# Patient Record
Sex: Female | Born: 1955 | Race: White | Hispanic: No | Marital: Married | Smoking: Former smoker
Health system: Southern US, Community
[De-identification: ages and names within clinical notes are randomized; demographics above are authoritative.]

## PROBLEM LIST (undated history)

## (undated) DIAGNOSIS — E119 Type 2 diabetes mellitus without complications: Secondary | ICD-10-CM

## (undated) DIAGNOSIS — I10 Essential (primary) hypertension: Secondary | ICD-10-CM

## (undated) HISTORY — PX: ABDOMINAL HYSTERECTOMY: SHX81

---

## 2004-08-05 ENCOUNTER — Ambulatory Visit: Payer: Self-pay | Admitting: Family Medicine

## 2005-03-02 ENCOUNTER — Ambulatory Visit: Payer: Self-pay | Admitting: Internal Medicine

## 2006-01-21 ENCOUNTER — Ambulatory Visit: Payer: Self-pay | Admitting: Internal Medicine

## 2007-02-01 ENCOUNTER — Ambulatory Visit: Payer: Self-pay | Admitting: Internal Medicine

## 2007-02-04 ENCOUNTER — Ambulatory Visit: Payer: Self-pay | Admitting: Unknown Physician Specialty

## 2008-03-21 ENCOUNTER — Ambulatory Visit: Payer: Self-pay | Admitting: Internal Medicine

## 2009-05-21 ENCOUNTER — Emergency Department: Payer: Self-pay | Admitting: Emergency Medicine

## 2009-09-10 ENCOUNTER — Ambulatory Visit: Payer: Self-pay | Admitting: Internal Medicine

## 2010-09-30 ENCOUNTER — Ambulatory Visit: Payer: Self-pay | Admitting: Internal Medicine

## 2012-05-24 ENCOUNTER — Ambulatory Visit: Payer: Self-pay | Admitting: Unknown Physician Specialty

## 2012-06-07 ENCOUNTER — Ambulatory Visit: Payer: Self-pay | Admitting: Internal Medicine

## 2012-07-15 ENCOUNTER — Ambulatory Visit: Payer: Self-pay | Admitting: Hematology and Oncology

## 2012-07-15 LAB — CBC CANCER CENTER
Basophil #: 0.1 x10 3/mm (ref 0.0–0.1)
Basophil %: 1.1 %
HCT: 39.1 % (ref 35.0–47.0)
HGB: 12.9 g/dL (ref 12.0–16.0)
Lymphocyte #: 3.1 x10 3/mm (ref 1.0–3.6)
Lymphocyte %: 41.8 %
MCH: 30.8 pg (ref 26.0–34.0)
MCHC: 33 g/dL (ref 32.0–36.0)
Neutrophil %: 46.3 %
Platelet: 289 x10 3/mm (ref 150–440)

## 2012-08-01 ENCOUNTER — Ambulatory Visit: Payer: Self-pay | Admitting: Unknown Physician Specialty

## 2012-08-02 ENCOUNTER — Ambulatory Visit: Payer: Self-pay | Admitting: Hematology and Oncology

## 2012-08-02 LAB — PATHOLOGY REPORT

## 2013-09-12 ENCOUNTER — Emergency Department: Payer: Self-pay | Admitting: Emergency Medicine

## 2013-09-12 LAB — COMPREHENSIVE METABOLIC PANEL
Albumin: 3.9 g/dL (ref 3.4–5.0)
Anion Gap: 8 (ref 7–16)
Bilirubin,Total: 0.6 mg/dL (ref 0.2–1.0)
Chloride: 102 mmol/L (ref 98–107)
Co2: 26 mmol/L (ref 21–32)
EGFR (Non-African Amer.): 56 — ABNORMAL LOW
Osmolality: 278 (ref 275–301)
SGOT(AST): 53 U/L — ABNORMAL HIGH (ref 15–37)
Sodium: 136 mmol/L (ref 136–145)
Total Protein: 8 g/dL (ref 6.4–8.2)

## 2013-09-12 LAB — URINALYSIS, COMPLETE
Blood: NEGATIVE
Granular Cast: 5
Hyaline Cast: 2
Ketone: NEGATIVE
Protein: 30
RBC,UR: 1 /HPF (ref 0–5)
Specific Gravity: 1.019 (ref 1.003–1.030)
Squamous Epithelial: <1

## 2013-09-12 LAB — CBC
HCT: 41.7 % (ref 35.0–47.0)
HGB: 14.3 g/dL (ref 12.0–16.0)
MCH: 31.1 pg (ref 26.0–34.0)
MCV: 91 fL (ref 80–100)
RBC: 4.59 10*6/uL (ref 3.80–5.20)
RDW: 12.9 % (ref 11.5–14.5)

## 2013-09-12 LAB — OCCULT BLOOD X 1 CARD TO LAB, STOOL: Occult Blood, Feces: NEGATIVE

## 2013-09-12 LAB — CLOSTRIDIUM DIFFICILE(ARMC)

## 2013-09-14 LAB — STOOL CULTURE

## 2014-04-01 DIAGNOSIS — E785 Hyperlipidemia, unspecified: Secondary | ICD-10-CM | POA: Insufficient documentation

## 2014-04-01 DIAGNOSIS — I10 Essential (primary) hypertension: Secondary | ICD-10-CM | POA: Insufficient documentation

## 2014-04-01 DIAGNOSIS — N183 Chronic kidney disease, stage 3 unspecified: Secondary | ICD-10-CM | POA: Insufficient documentation

## 2014-04-01 DIAGNOSIS — E1169 Type 2 diabetes mellitus with other specified complication: Secondary | ICD-10-CM | POA: Insufficient documentation

## 2014-04-11 ENCOUNTER — Ambulatory Visit: Payer: Self-pay | Admitting: Internal Medicine

## 2015-05-20 DIAGNOSIS — Z792 Long term (current) use of antibiotics: Secondary | ICD-10-CM | POA: Insufficient documentation

## 2015-05-20 DIAGNOSIS — Z79899 Other long term (current) drug therapy: Secondary | ICD-10-CM | POA: Insufficient documentation

## 2015-05-20 DIAGNOSIS — K5000 Crohn's disease of small intestine without complications: Secondary | ICD-10-CM | POA: Insufficient documentation

## 2015-05-20 DIAGNOSIS — E119 Type 2 diabetes mellitus without complications: Secondary | ICD-10-CM | POA: Insufficient documentation

## 2015-05-20 DIAGNOSIS — R109 Unspecified abdominal pain: Secondary | ICD-10-CM | POA: Diagnosis present

## 2015-05-20 DIAGNOSIS — Z87891 Personal history of nicotine dependence: Secondary | ICD-10-CM | POA: Insufficient documentation

## 2015-05-20 DIAGNOSIS — I10 Essential (primary) hypertension: Secondary | ICD-10-CM | POA: Diagnosis not present

## 2015-05-20 LAB — CBC WITH DIFFERENTIAL/PLATELET
BASOS ABS: 0 10*3/uL (ref 0–0.1)
BASOS PCT: 0 %
EOS ABS: 0.4 10*3/uL (ref 0–0.7)
EOS PCT: 4 %
HEMATOCRIT: 39.4 % (ref 35.0–47.0)
Hemoglobin: 13.3 g/dL (ref 12.0–16.0)
LYMPHS PCT: 27 %
Lymphs Abs: 2.6 10*3/uL (ref 1.0–3.6)
MCH: 30.5 pg (ref 26.0–34.0)
MCHC: 33.7 g/dL (ref 32.0–36.0)
MCV: 90.5 fL (ref 80.0–100.0)
Monocytes Absolute: 0.5 10*3/uL (ref 0.2–0.9)
Monocytes Relative: 5 %
Neutro Abs: 6 10*3/uL (ref 1.4–6.5)
Neutrophils Relative %: 64 %
Platelets: 301 10*3/uL (ref 150–440)
RBC: 4.35 MIL/uL (ref 3.80–5.20)
RDW: 13.9 % (ref 11.5–14.5)
WBC: 9.4 10*3/uL (ref 3.6–11.0)

## 2015-05-20 LAB — COMPREHENSIVE METABOLIC PANEL
ALBUMIN: 4 g/dL (ref 3.5–5.0)
ALK PHOS: 70 U/L (ref 38–126)
ALT: 23 U/L (ref 14–54)
AST: 23 U/L (ref 15–41)
Anion gap: 13 (ref 5–15)
BILIRUBIN TOTAL: 0.5 mg/dL (ref 0.3–1.2)
BUN: 17 mg/dL (ref 6–20)
CHLORIDE: 99 mmol/L — AB (ref 101–111)
CO2: 25 mmol/L (ref 22–32)
CREATININE: 0.88 mg/dL (ref 0.44–1.00)
Calcium: 9.3 mg/dL (ref 8.9–10.3)
GFR calc Af Amer: >60 mL/min (ref >60)
Glucose, Bld: 185 mg/dL — ABNORMAL HIGH (ref 65–99)
POTASSIUM: 3.5 mmol/L (ref 3.5–5.1)
Sodium: 137 mmol/L (ref 135–145)
Total Protein: 7.1 g/dL (ref 6.5–8.1)

## 2015-05-20 LAB — LIPASE, BLOOD: Lipase: 29 U/L (ref 22–51)

## 2015-05-20 LAB — TROPONIN I: Troponin I: <0.03 ng/mL (ref <0.031)

## 2015-05-20 NOTE — ED Notes (Signed)
Pt in with co upper abd pain, has had n.v.d since this am.

## 2015-05-21 ENCOUNTER — Emergency Department
Admission: EM | Admit: 2015-05-21 | Discharge: 2015-05-21 | Disposition: A | Discharge status: Home / Self Care | Payer: BLUE CROSS/BLUE SHIELD | Attending: Emergency Medicine | Admitting: Emergency Medicine

## 2015-05-21 ENCOUNTER — Emergency Department: Payer: BLUE CROSS/BLUE SHIELD

## 2015-05-21 ENCOUNTER — Encounter: Payer: Self-pay | Admitting: Emergency Medicine

## 2015-05-21 DIAGNOSIS — K5000 Crohn's disease of small intestine without complications: Secondary | ICD-10-CM

## 2015-05-21 DIAGNOSIS — R1110 Vomiting, unspecified: Secondary | ICD-10-CM

## 2015-05-21 DIAGNOSIS — R197 Diarrhea, unspecified: Secondary | ICD-10-CM

## 2015-05-21 HISTORY — DX: Type 2 diabetes mellitus without complications: E11.9

## 2015-05-21 HISTORY — DX: Essential (primary) hypertension: I10

## 2015-05-21 LAB — URINALYSIS COMPLETE WITH MICROSCOPIC (ARMC ONLY)
Bilirubin Urine: NEGATIVE
GLUCOSE, UA: NEGATIVE mg/dL
Hgb urine dipstick: NEGATIVE
Ketones, ur: NEGATIVE mg/dL
Nitrite: NEGATIVE
PROTEIN: NEGATIVE mg/dL
Specific Gravity, Urine: 1.02 (ref 1.005–1.030)
pH: 5 (ref 5.0–8.0)

## 2015-05-21 LAB — TROPONIN I: Troponin I: <0.03 ng/mL (ref <0.031)

## 2015-05-21 MED ORDER — SODIUM CHLORIDE 0.9 % IV BOLUS (SEPSIS)
1000.0000 mL | Freq: Once | INTRAVENOUS | Status: AC
  Administered 2015-05-21: 1000 mL via INTRAVENOUS

## 2015-05-21 MED ORDER — METOCLOPRAMIDE HCL 5 MG/ML IJ SOLN
10.0000 mg | Freq: Once | INTRAMUSCULAR | Status: AC
  Administered 2015-05-21: 10 mg via INTRAVENOUS
  Filled 2015-05-21: qty 2

## 2015-05-21 MED ORDER — ONDANSETRON HCL 4 MG/2ML IJ SOLN
4.0000 mg | Freq: Once | INTRAMUSCULAR | Status: AC
  Administered 2015-05-21: 4 mg via INTRAVENOUS
  Filled 2015-05-21: qty 2

## 2015-05-21 MED ORDER — METOCLOPRAMIDE HCL 10 MG PO TABS: 10.0000 mg | ORAL_TABLET | Freq: Four times a day (QID) | ORAL | Status: DC | PRN

## 2015-05-21 MED ORDER — METRONIDAZOLE IN NACL 5-0.79 MG/ML-% IV SOLN
500.0000 mg | Freq: Once | INTRAVENOUS | Status: AC
  Administered 2015-05-21: 500 mg via INTRAVENOUS
  Filled 2015-05-21: qty 100

## 2015-05-21 MED ORDER — CIPROFLOXACIN IN D5W 400 MG/200ML IV SOLN
400.0000 mg | Freq: Once | INTRAVENOUS | Status: AC
  Administered 2015-05-21: 400 mg via INTRAVENOUS
  Filled 2015-05-21 (×2): qty 200

## 2015-05-21 MED ORDER — METRONIDAZOLE 500 MG PO TABS: 500.0000 mg | ORAL_TABLET | Freq: Three times a day (TID) | ORAL | Status: AC

## 2015-05-21 MED ORDER — IOHEXOL 240 MG/ML SOLN
25.0000 mL | Freq: Once | INTRAMUSCULAR | Status: AC | PRN
  Administered 2015-05-21: 25 mL via ORAL

## 2015-05-21 MED ORDER — CIPROFLOXACIN HCL 500 MG PO TABS
500.0000 mg | ORAL_TABLET | Freq: Two times a day (BID) | ORAL | Status: AC
Start: 2015-05-21 — End: 2015-05-28

## 2015-05-21 MED ORDER — IOHEXOL 300 MG/ML  SOLN
100.0000 mL | Freq: Once | INTRAMUSCULAR | Status: AC | PRN
  Administered 2015-05-21: 100 mL via INTRAVENOUS

## 2015-05-21 NOTE — ED Provider Notes (Signed)
[facility] Emergency Department Provider Note  ____________________________________________  Time seen: Approximately 0051 AM  I have reviewed the triage vital signs and the nursing notes.   HISTORY  Chief Complaint Abdominal Pain    HPI Regina Moreno is a 59 y.o. female comes in with upper abdominal pain, vomiting and diarrhea. The patient reports that the pain started midmorning and she thought it was gas but the pain continued to progress during the day. The patient reports that she ate very little but vomited. The patient reports that she also had some diarrhea. The patient reports that she continued to vomit and had cramping in her feet and legs felt so severe. The patient walked around and every time she would lay down she would vomit. The patient reports that throughout the day she became dizzy and lightheaded. Currently her pain is a 2 out of 10 in intensity. She reports the emesis was nonbilious and nonbloody. The patient reports that she was unable to tolerate the pain at home so she decided to come in for evaluation and hydration.   Past Medical History  Diagnosis Date  . Diabetes mellitus without complication   . Hypertension     There are no active problems to display for this patient.   History reviewed. No pertinent past surgical history.  Current Outpatient Rx  Name  Route  Sig  Dispense  Refill  . bisoprolol (ZEBETA) 10 MG tablet   Oral   Take 1 tablet by mouth daily.         Marland Kitchen glipiZIDE-metformin (METAGLIP) 2.5-500 MG per tablet   Oral   Take 2 tablets by mouth 2 (two) times daily.         . Liraglutide (VICTOZA) 18 MG/3ML SOPN   Subcutaneous   Inject 1.8 mg into the skin every morning.         Marland Kitchen losartan-hydrochlorothiazide (HYZAAR) 100-12.5 MG per tablet   Oral   Take 1 tablet by mouth daily.         Marland Kitchen omeprazole (PRILOSEC) 20 MG capsule   Oral   Take 1 capsule by mouth daily.         . ciprofloxacin  (CIPRO) 500 MG tablet   Oral   Take 1 tablet (500 mg total) by mouth 2 (two) times daily.   14 tablet   0   . metoCLOPramide (REGLAN) 10 MG tablet   Oral   Take 1 tablet (10 mg total) by mouth every 6 (six) hours as needed for nausea or vomiting.   20 tablet   0   . metroNIDAZOLE (FLAGYL) 500 MG tablet   Oral   Take 1 tablet (500 mg total) by mouth 3 (three) times daily.   14 tablet   0     Allergies Review of patient's allergies indicates no active allergies.  History reviewed. No pertinent family history.  Social History History  Substance Use Topics  . Smoking status: Former Smoker    Types: Cigarettes  . Smokeless tobacco: Not on file  . Alcohol Use: No    Review of Systems Constitutional: No fever/chills Eyes: No visual changes. ENT: No sore throat. Cardiovascular: Denies chest pain. Respiratory: Denies shortness of breath. Gastrointestinal:abdominal pain.  Nausea, and vomiting.  No diarrhea.  No constipation. Genitourinary: Negative for dysuria. Musculoskeletal: Negative for back pain. Skin: Negative for rash. Neurological: Negative for headaches, focal weakness or numbness.  10-point ROS otherwise negative.  ____________________________________________   PHYSICAL EXAM:  VITAL SIGNS: ED  Triage Vitals  Enc Vitals Group     BP 05/20/15 2248 133/82 mmHg     Pulse Rate 05/20/15 2248 100     Resp 05/20/15 2248 18     Temp 05/20/15 2248 99 F (37.2 C)     Temp Source 05/20/15 2248 Oral     SpO2 05/20/15 2248 97 %     Weight 05/20/15 2248 190 lb (86.183 kg)     Height 05/20/15 2248 5\' 6"  (1.676 m)     Head Cir --      Peak Flow --      Pain Score 05/20/15 2249 6     Pain Loc --      Pain Edu? --      Excl. in GC? --     Constitutional: Alert and oriented. Well appearing and in no acute distress. Eyes: Conjunctivae are normal. PERRL. EOMI. Head: Atraumatic. Nose: No congestion/rhinnorhea. Mouth/Throat: Mucous membranes are moist.  Oropharynx  non-erythematous. Cardiovascular: Normal rate, regular rhythm. Grossly normal heart sounds.  Good peripheral circulation. Respiratory: Normal respiratory effort.  No retractions. Lungs CTAB. Gastrointestinal: Soft and nontender. No distention. Positive bowel sounds Genitourinary: deferred Musculoskeletal: No lower extremity tenderness nor edema.  No joint effusions. Neurologic:  Normal speech and language. No gross focal neurologic deficits are appreciated. No gait instability. Skin:  Skin is warm, dry and intact. No rash noted. Psychiatric: Mood and affect are normal.   ____________________________________________   LABS (all labs ordered are listed, but only abnormal results are displayed)  Labs Reviewed  COMPREHENSIVE METABOLIC PANEL - Abnormal; Notable for the following:    Chloride 99 (*)    Glucose, Bld 185 (*)    All other components within normal limits  URINALYSIS COMPLETEWITH MICROSCOPIC ([facility] ONLY) - Abnormal; Notable for the following:    Color, Urine YELLOW (*)    APPearance CLEAR (*)    Leukocytes, UA TRACE (*)    Bacteria, UA RARE (*)    Squamous Epithelial / LPF 0-5 (*)    All other components within normal limits  CBC WITH DIFFERENTIAL/PLATELET  LIPASE, BLOOD  TROPONIN I  TROPONIN I   ____________________________________________  EKG  None ____________________________________________  RADIOLOGY  CT scan: Apparent mild wall thickening along the terminal ileum with mild stranding about the distal ileum. This may reflect a mild infectious or inflammatory ileitis ____________________________________________   PROCEDURES  Procedure(s) performed: None  Critical Care performed: No  ____________________________________________   INITIAL IMPRESSION / ASSESSMENT AND PLAN / ED COURSE  Pertinent labs & imaging results that were available during my care of the patient were reviewed by me and considered in my medical decision making (see chart for  details).  This is a 59 year old female who comes in with abdominal pain vomiting and diarrhea. Initially the patient's pain was controlled and her vomiting was improved but the patient reports that she started vomiting after receiving the dose of Zofran. The patient received Zofran and since she was having significant pain a CT scan was did not show any acute injuries the patient did receive some ciprofloxacin and Flagyl. The patient reports that she does feel much better and she'll be discharged home. ____________________________________________   FINAL CLINICAL IMPRESSION(S) / ED DIAGNOSES  Final diagnoses:  Ileitis, terminal, without complications  Vomiting and diarrhea      Rebecka ApleyAllison P Chloie Loney, MD 05/21/15 [phone]

## 2015-05-21 NOTE — ED Notes (Signed)
MD at bedside checking in on pt

## 2015-05-21 NOTE — Discharge Instructions (Signed)
Nausea and Vomiting Nausea is a sick feeling that often comes before throwing up (vomiting). Vomiting is a reflex where stomach contents come out of your mouth. Vomiting can cause severe loss of body fluids (dehydration). Children and elderly adults can become dehydrated quickly, especially if they also have diarrhea. Nausea and vomiting are symptoms of a condition or disease. It is important to find the cause of your symptoms. CAUSES   Direct irritation of the stomach lining. This irritation can result from increased acid production (gastroesophageal reflux disease), infection, food poisoning, taking certain medicines (such as nonsteroidal anti-inflammatory drugs), alcohol use, or tobacco use.  Signals from the brain.These signals could be caused by a headache, heat exposure, an inner ear disturbance, increased pressure in the brain from injury, infection, a tumor, or a concussion, pain, emotional stimulus, or metabolic problems.  An obstruction in the gastrointestinal tract (bowel obstruction).  Illnesses such as diabetes, hepatitis, gallbladder problems, appendicitis, kidney problems, cancer, sepsis, atypical symptoms of a heart attack, or eating disorders.  Medical treatments such as chemotherapy and radiation.  Receiving medicine that makes you sleep (general anesthetic) during surgery. DIAGNOSIS Your caregiver may ask for tests to be done if the problems do not improve after a few days. Tests may also be done if symptoms are severe or if the reason for the nausea and vomiting is not clear. Tests may include:  Urine tests.  Blood tests.  Stool tests.  Cultures (to look for evidence of infection).  X-rays or other imaging studies. Test results can help your caregiver make decisions about treatment or the need for additional tests. TREATMENT You need to stay well hydrated. Drink frequently but in small amounts.You may wish to drink water, sports drinks, clear broth, or eat frozen  ice pops or gelatin dessert to help stay hydrated.When you eat, eating slowly may help prevent nausea.There are also some antinausea medicines that may help prevent nausea. HOME CARE INSTRUCTIONS   Take all medicine as directed by your caregiver.  If you do not have an appetite, do not force yourself to eat. However, you must continue to drink fluids.  If you have an appetite, eat a normal diet unless your caregiver tells you differently.  Eat a variety of complex carbohydrates (rice, wheat, potatoes, bread), lean meats, yogurt, fruits, and vegetables.  Avoid high-fat foods because they are more difficult to digest.  Drink enough water and fluids to keep your urine clear or pale yellow.  If you are dehydrated, ask your caregiver for specific rehydration instructions. Signs of dehydration may include:  Severe thirst.  Dry lips and mouth.  Dizziness.  Dark urine.  Decreasing urine frequency and amount.  Confusion.  Rapid breathing or pulse. SEEK IMMEDIATE MEDICAL CARE IF:   You have blood or brown flecks (like coffee grounds) in your vomit.  You have black or bloody stools.  You have a severe headache or stiff neck.  You are confused.  You have severe abdominal pain.  You have chest pain or trouble breathing.  You do not urinate at least once every 8 hours.  You develop cold or clammy skin.  You continue to vomit for longer than 24 to 48 hours.  You have a fever. MAKE SURE YOU:   Understand these instructions.  Will watch your condition.  Will get help right away if you are not doing well or get worse. Document Released: 10/19/2005 Document Revised: 01/11/2012 Document Reviewed: 03/18/2011 [employer] Patient Information 2015 [employer]. This information is not intended   to replace advice given to you by your health care provider. Make sure you discuss any questions you have with your health care provider.  Colitis Colitis is inflammation of the colon.  Colitis can be a short-term or long-standing (chronic) illness. Crohn's disease and ulcerative colitis are 2 types of colitis which are chronic. They usually require lifelong treatment. CAUSES  There are many different causes of colitis, including:  Viruses.  Germs (bacteria).  Medicine reactions. SYMPTOMS   Diarrhea.  Intestinal bleeding.  Pain.  Fever.  Throwing up (vomiting).  Tiredness (fatigue).  Weight loss.  Bowel blockage. DIAGNOSIS  The diagnosis of colitis is based on examination and stool or blood tests. X-rays, CT scan, and colonoscopy may also be needed. TREATMENT  Treatment may include:  Fluids given through the vein (intravenously).  Bowel rest (nothing to eat or drink for a period of time).  Medicine for pain and diarrhea.  Medicines (antibiotics) that kill germs.  Cortisone medicines.  Surgery. HOME CARE INSTRUCTIONS   Get plenty of rest.  Drink enough water and fluids to keep your urine clear or pale yellow.  Eat a well-balanced diet.  Call your caregiver for follow-up as recommended. SEEK IMMEDIATE MEDICAL CARE IF:   You develop chills.  You have an oral temperature above 102 F (38.9 C), not controlled by medicine.  You have extreme weakness, fainting, or dehydration.  You have repeated vomiting.  You develop severe belly (abdominal) pain or are passing bloody or tarry stools. MAKE SURE YOU:   Understand these instructions.  Will watch your condition.  Will get help right away if you are not doing well or get worse. Document Released: 11/26/2004 Document Revised: 01/11/2012 Document Reviewed: 02/21/2010 [facility] Patient Information 2015 [place], [place]. This information is not intended to replace advice given to you by your health care provider. Make sure you discuss any questions you have with your health care provider.  Diarrhea Diarrhea is frequent loose and watery bowel movements. It can cause you to feel weak and  dehydrated. Dehydration can cause you to become tired and thirsty, have a dry mouth, and have decreased urination that often is dark yellow. Diarrhea is a sign of another problem, most often an infection that will not last long. In most cases, diarrhea typically lasts 2-3 days. However, it can last longer if it is a sign of something more serious. It is important to treat your diarrhea as directed by your caregiver to lessen or prevent future episodes of diarrhea. CAUSES  Some common causes include:  Gastrointestinal infections caused by viruses, bacteria, or parasites.  Food poisoning or food allergies.  Certain medicines, such as antibiotics, chemotherapy, and laxatives.  Artificial sweeteners and fructose.  Digestive disorders. HOME CARE INSTRUCTIONS  Ensure adequate fluid intake (hydration): Have 1 cup (8 oz) of fluid for each diarrhea episode. Avoid fluids that contain simple sugars or sports drinks, fruit juices, whole milk products, and sodas. Your urine should be clear or pale yellow if you are drinking enough fluids. Hydrate with an oral rehydration solution that you can purchase at pharmacies, retail stores, and online. You can prepare an oral rehydration solution at home by mixing the following ingredients together:   - tsp table salt.   tsp baking soda.   tsp salt substitute containing potassium chloride.  1  tablespoons sugar.  1 L (34 oz) of water.  Certain foods and beverages may increase the speed at which food moves through the gastrointestinal (GI) tract. These foods and beverages  should be avoided and include:  Caffeinated and alcoholic beverages.  High-fiber foods, such as raw fruits and vegetables, nuts, seeds, and whole grain breads and cereals.  Foods and beverages sweetened with sugar alcohols, such as xylitol, sorbitol, and mannitol.  Some foods may be well tolerated and may help thicken stool including:  Starchy foods, such as rice, toast, pasta,  low-sugar cereal, oatmeal, grits, baked potatoes, crackers, and bagels.  Bananas.  Applesauce.  Add probiotic-rich foods to help increase healthy bacteria in the GI tract, such as yogurt and fermented milk products.  Wash your hands well after each diarrhea episode.  Only take over-the-counter or prescription medicines as directed by your caregiver.  Take a warm bath to relieve any burning or pain from frequent diarrhea episodes. SEEK IMMEDIATE MEDICAL CARE IF:   You are unable to keep fluids down.  You have persistent vomiting.  You have blood in your stool, or your stools are black and tarry.  You do not urinate in 6-8 hours, or there is only a small amount of very dark urine.  You have abdominal pain that increases or localizes.  You have weakness, dizziness, confusion, or light-headedness.  You have a severe headache.  Your diarrhea gets worse or does not get better.  You have a fever or persistent symptoms for more than 2-3 days.  You have a fever and your symptoms suddenly get worse. MAKE SURE YOU:   Understand these instructions.  Will watch your condition.  Will get help right away if you are not doing well or get worse. Document Released: 10/09/2002 Document Revised: 03/05/2014 Document Reviewed: 06/26/2012 [facility] Patient Information 2015 [place]. This information is not intended to replace advice given to you by your health care provider. Make sure you discuss any questions you have with your health care provider.

## 2015-10-09 DIAGNOSIS — Z0000 Encounter for general adult medical examination without abnormal findings: Secondary | ICD-10-CM | POA: Insufficient documentation

## 2015-10-09 DIAGNOSIS — Z1211 Encounter for screening for malignant neoplasm of colon: Secondary | ICD-10-CM | POA: Insufficient documentation

## 2018-02-08 ENCOUNTER — Ambulatory Visit
Admission: RE | Admit: 2018-02-08 | Discharge: 2018-02-08 | Disposition: A | Discharge status: Home / Self Care | Payer: BLUE CROSS/BLUE SHIELD | Source: Ambulatory Visit | Attending: Internal Medicine | Admitting: Internal Medicine

## 2018-02-08 ENCOUNTER — Other Ambulatory Visit: Payer: Self-pay | Admitting: Internal Medicine

## 2018-02-08 DIAGNOSIS — Z1231 Encounter for screening mammogram for malignant neoplasm of breast: Secondary | ICD-10-CM

## 2018-03-14 ENCOUNTER — Other Ambulatory Visit: Payer: Self-pay

## 2018-03-14 ENCOUNTER — Emergency Department
Admission: EM | Admit: 2018-03-14 | Discharge: 2018-03-14 | Disposition: A | Discharge status: Home / Self Care | Payer: BLUE CROSS/BLUE SHIELD | Attending: Emergency Medicine | Admitting: Emergency Medicine

## 2018-03-14 ENCOUNTER — Encounter: Payer: Self-pay | Admitting: Emergency Medicine

## 2018-03-14 DIAGNOSIS — E119 Type 2 diabetes mellitus without complications: Secondary | ICD-10-CM | POA: Insufficient documentation

## 2018-03-14 DIAGNOSIS — Z7984 Long term (current) use of oral hypoglycemic drugs: Secondary | ICD-10-CM | POA: Insufficient documentation

## 2018-03-14 DIAGNOSIS — Z79899 Other long term (current) drug therapy: Secondary | ICD-10-CM | POA: Diagnosis not present

## 2018-03-14 DIAGNOSIS — Z87891 Personal history of nicotine dependence: Secondary | ICD-10-CM | POA: Insufficient documentation

## 2018-03-14 DIAGNOSIS — I10 Essential (primary) hypertension: Secondary | ICD-10-CM | POA: Insufficient documentation

## 2018-03-14 DIAGNOSIS — R55 Syncope and collapse: Secondary | ICD-10-CM | POA: Insufficient documentation

## 2018-03-14 LAB — CBC
HCT: 44.8 % (ref 35.0–47.0)
Hemoglobin: 15.1 g/dL (ref 12.0–16.0)
MCH: 31.1 pg (ref 26.0–34.0)
MCHC: 33.8 g/dL (ref 32.0–36.0)
MCV: 92.1 fL (ref 80.0–100.0)
PLATELETS: 337 10*3/uL (ref 150–440)
RBC: 4.86 MIL/uL (ref 3.80–5.20)
RDW: 13.5 % (ref 11.5–14.5)
WBC: 9.9 10*3/uL (ref 3.6–11.0)

## 2018-03-14 LAB — URINALYSIS, COMPLETE (UACMP) WITH MICROSCOPIC
Bilirubin Urine: NEGATIVE
Glucose, UA: >=500 mg/dL — AB
Hgb urine dipstick: NEGATIVE
Ketones, ur: 5 mg/dL — AB
Leukocytes, UA: NEGATIVE
Nitrite: NEGATIVE
PH: 5 (ref 5.0–8.0)
Protein, ur: NEGATIVE mg/dL
SPECIFIC GRAVITY, URINE: 1.022 (ref 1.005–1.030)

## 2018-03-14 LAB — TROPONIN I

## 2018-03-14 LAB — BASIC METABOLIC PANEL
Anion gap: 14 (ref 5–15)
BUN: 19 mg/dL (ref 6–20)
CALCIUM: 9.6 mg/dL (ref 8.9–10.3)
CO2: 24 mmol/L (ref 22–32)
Chloride: 96 mmol/L — ABNORMAL LOW (ref 101–111)
Creatinine, Ser: 1.08 mg/dL — ABNORMAL HIGH (ref 0.44–1.00)
GFR calc non Af Amer: 54 mL/min — ABNORMAL LOW (ref >60)
Glucose, Bld: 175 mg/dL — ABNORMAL HIGH (ref 65–99)
Potassium: 4.5 mmol/L (ref 3.5–5.1)
SODIUM: 134 mmol/L — AB (ref 135–145)

## 2018-03-14 MED ORDER — SODIUM CHLORIDE 0.9 % IV BOLUS
1000.0000 mL | Freq: Once | INTRAVENOUS | Status: AC
  Administered 2018-03-14: 1000 mL via INTRAVENOUS

## 2018-03-14 NOTE — ED Triage Notes (Signed)
Says was at work and she got lightheaded and felt like she was passing out.  Has had congestion for several days.  Then everytime she moved it made her feel like passing outand dizziness.

## 2018-03-14 NOTE — ED Provider Notes (Signed)
[facility] Emergency Department Provider Note  ____________________________________________   First MD Initiated Contact with Patient 03/14/18 1401     (approximate)  I have reviewed the triage vital signs and the nursing notes.   HISTORY  Chief Complaint Near Syncope; Dizziness; and Nasal Congestion   HPI Regina Moreno is a 62 y.o. female with a history of diabetes as well as hypertension was presenting to the emergency department today after near syncopal episode.  She says that she is feeling ill over the past 2 weeks with nasal congestion.  She went to the doctor this past Friday and was given a prescription for Levaquin.  Says that the Levaquin has caused some degree of diarrhea as well as abdominal cramping.  Says that she was standing up doing some filing at work today when she began to feel hot and lightheaded.  She said that she had to sit down and recline for about 45 minutes but the symptoms did not abate especially when she tried to sit up.  Ambulance was called and she had a normal EKG.  She then came to the emergency department for further evaluation.  Denies any chest pain, shortness of breath or palpitations.  Said that she has had issues with dehydration in the past requiring IV fluids.  Says that this feels similar.  Says that she feels a slight lightheadedness feeling now but otherwise denies any complaints.   Past Medical History:  Diagnosis Date  . Diabetes mellitus without complication (HCC)   . Hypertension     There are no active problems to display for this patient.   Past Surgical History:  Procedure Laterality Date  . ABDOMINAL HYSTERECTOMY      Prior to Admission medications   Medication Sig Start Date End Date Taking? Authorizing Provider  bisoprolol (ZEBETA) 10 MG tablet Take 1 tablet by mouth daily.    [provider]  glipiZIDE-metformin (METAGLIP) 2.5-500 MG per tablet Take 2 tablets by mouth 2 (two) times  daily.    [provider]  Liraglutide (VICTOZA) 18 MG/3ML SOPN Inject 1.8 mg into the skin every morning.    [provider]  losartan-hydrochlorothiazide (HYZAAR) 100-12.5 MG per tablet Take 1 tablet by mouth daily.    [provider]  metoCLOPramide (REGLAN) 10 MG tablet Take 1 tablet (10 mg total) by mouth every 6 (six) hours as needed for nausea or vomiting. 05/21/15 05/20/16  Rebecka Apley, MD  omeprazole (PRILOSEC) 20 MG capsule Take 1 capsule by mouth daily.    [provider]    Allergies Ampicillin; Fenofibrate; Simvastatin; and Sulfa antibiotics  Family History  Problem Relation Age of Onset  . Breast cancer Neg Hx     Social History Social History   Tobacco Use  . Smoking status: Former Smoker    Types: Cigarettes  . Smokeless tobacco: Never Used  Substance Use Topics  . Alcohol use: No  . Drug use: No    Review of Systems  Constitutional: No fever/chills Eyes: No visual changes. ENT: No sore throat. Cardiovascular: Denies chest pain. Respiratory: Denies shortness of breath. Gastrointestinal: No abdominal pain.  no vomiting.  No constipation. Genitourinary: Negative for dysuria. Musculoskeletal: Negative for back pain. Skin: Negative for rash. Neurological: Negative for headaches, focal weakness or numbness.   ____________________________________________   PHYSICAL EXAM:  VITAL SIGNS: ED Triage Vitals  Enc Vitals Group     BP 03/14/18 1256 (!) 144/78     Pulse Rate 03/14/18 1256  91     Resp 03/14/18 1256 18     Temp 03/14/18 1256 97.7 F (36.5 C)     Temp Source 03/14/18 1256 Oral     SpO2 03/14/18 1256 98 %     Weight 03/14/18 1256 175 lb (79.4 kg)     Height 03/14/18 1256  (1.676 m)     Head Circumference --      Peak Flow --      Pain Score 03/14/18 1303 0     Pain Loc --      Pain Edu? --      Excl. in GC? --     Constitutional: Alert and oriented. Well appearing and in no acute  distress. Eyes: Conjunctivae are normal.  Head: Atraumatic.  Normal TMs bilaterally. Nose: No congestion/rhinnorhea. Mouth/Throat: Mucous membranes are moist.  Neck: No stridor.   Cardiovascular: Normal rate, regular rhythm. Grossly normal heart sounds.  Good peripheral circulation. Respiratory: Normal respiratory effort.  No retractions. Lungs CTAB. Gastrointestinal: Soft and nontender. No distention. No CVA tenderness. Musculoskeletal: No lower extremity tenderness nor edema.  No joint effusions. Neurologic:  Normal speech and language. No gross focal neurologic deficits are appreciated.  No nystagmus.  No ataxia on finger-to-nose testing.  No ataxia on heel-to-shin testing. Skin:  Skin is warm, dry and intact. No rash noted. Psychiatric: Mood and affect are normal. Speech and behavior are normal.  ____________________________________________   LABS (all labs ordered are listed, but only abnormal results are displayed)  Labs Reviewed  BASIC METABOLIC PANEL - Abnormal; Notable for the following components:      Result Value   Sodium 134 (*)    Chloride 96 (*)    Glucose, Bld 175 (*)    Creatinine, Ser 1.08 (*)    GFR calc non Af Amer 54 (*)    All other components within normal limits  URINALYSIS, COMPLETE (UACMP) WITH MICROSCOPIC - Abnormal; Notable for the following components:   Color, Urine YELLOW (*)    APPearance CLEAR (*)    Glucose, UA >=500 (*)    Ketones, ur 5 (*)    Bacteria, UA RARE (*)    All other components within normal limits  CBC  TROPONIN I  CBG MONITORING, ED   ____________________________________________  EKG  ED ECG REPORT I, Arelia Longest, the attending physician, personally viewed and interpreted this ECG.   Date: 03/14/2018  EKG Time: 1314  Rate: 91  Rhythm: normal sinus rhythm  Axis: Normal  Intervals:none  ST&T Change: No ST segment elevation or depression.  No abnormal T wave  inversion.  ____________________________________________  RADIOLOGY   ____________________________________________   PROCEDURES  Procedure(s) performed:   Procedures  Critical Care performed:   ____________________________________________   INITIAL IMPRESSION / ASSESSMENT AND PLAN / ED COURSE  Pertinent labs & imaging results that were available during my care of the patient were reviewed by me and considered in my medical decision making (see chart for details).  DDX: Sinusitis, near-syncope, arrhythmia, vertigo, dehydration, ACS As part of my medical decision making, I reviewed the following data within the electronic MEDICAL RECORD NUMBER Notes from prior ED visits  ----------------------------------------- 4:29 PM on 03/14/2018 -----------------------------------------  Patient feeling improved after IV fluids.  Likely vasovagal symptoms related to dehydration.  Patient understand the diagnosis as well as treatment plan willing to comply but will be discharged at this time. ____________________________________________   FINAL CLINICAL IMPRESSION(S) / ED DIAGNOSES  Near syncope.    NEW MEDICATIONS STARTED  DURING THIS VISIT:  New Prescriptions   No medications on file     Note:  This document was prepared using Dragon voice recognition software and may include unintentional dictation errors.     Myrna Blazer, MD 03/14/18 [phone]

## 2018-08-23 ENCOUNTER — Emergency Department
Admission: EM | Admit: 2018-08-23 | Discharge: 2018-08-23 | Disposition: A | Discharge status: Home / Self Care | Payer: BLUE CROSS/BLUE SHIELD | Attending: Emergency Medicine | Admitting: Emergency Medicine

## 2018-08-23 ENCOUNTER — Other Ambulatory Visit: Payer: Self-pay

## 2018-08-23 DIAGNOSIS — R42 Dizziness and giddiness: Secondary | ICD-10-CM | POA: Diagnosis not present

## 2018-08-23 DIAGNOSIS — E119 Type 2 diabetes mellitus without complications: Secondary | ICD-10-CM | POA: Diagnosis not present

## 2018-08-23 DIAGNOSIS — Z87891 Personal history of nicotine dependence: Secondary | ICD-10-CM | POA: Diagnosis not present

## 2018-08-23 DIAGNOSIS — Z7984 Long term (current) use of oral hypoglycemic drugs: Secondary | ICD-10-CM | POA: Diagnosis not present

## 2018-08-23 DIAGNOSIS — E860 Dehydration: Secondary | ICD-10-CM | POA: Diagnosis not present

## 2018-08-23 DIAGNOSIS — R531 Weakness: Secondary | ICD-10-CM | POA: Diagnosis present

## 2018-08-23 DIAGNOSIS — I10 Essential (primary) hypertension: Secondary | ICD-10-CM | POA: Diagnosis not present

## 2018-08-23 DIAGNOSIS — Z79899 Other long term (current) drug therapy: Secondary | ICD-10-CM | POA: Diagnosis not present

## 2018-08-23 LAB — CBC
HEMATOCRIT: 44.1 % (ref 36.0–46.0)
HEMOGLOBIN: 14.9 g/dL (ref 12.0–15.0)
MCH: 30.3 pg (ref 26.0–34.0)
MCHC: 33.8 g/dL (ref 30.0–36.0)
MCV: 89.8 fL (ref 80.0–100.0)
NRBC: 0 % (ref 0.0–0.2)
Platelets: 322 10*3/uL (ref 150–400)
RBC: 4.91 MIL/uL (ref 3.87–5.11)
RDW: 12.9 % (ref 11.5–15.5)
WBC: 8 10*3/uL (ref 4.0–10.5)

## 2018-08-23 LAB — URINALYSIS, COMPLETE (UACMP) WITH MICROSCOPIC
BILIRUBIN URINE: NEGATIVE
Glucose, UA: >=500 mg/dL — AB
Hgb urine dipstick: NEGATIVE
Ketones, ur: NEGATIVE mg/dL
NITRITE: NEGATIVE
Protein, ur: NEGATIVE mg/dL
SPECIFIC GRAVITY, URINE: 1.016 (ref 1.005–1.030)
pH: 5 (ref 5.0–8.0)

## 2018-08-23 LAB — BASIC METABOLIC PANEL
ANION GAP: 14 (ref 5–15)
BUN: 27 mg/dL — AB (ref 8–23)
CO2: 24 mmol/L (ref 22–32)
Calcium: 9.4 mg/dL (ref 8.9–10.3)
Chloride: 98 mmol/L (ref 98–111)
Creatinine, Ser: 1.37 mg/dL — ABNORMAL HIGH (ref 0.44–1.00)
GFR calc Af Amer: 47 mL/min — ABNORMAL LOW (ref >60)
GFR, EST NON AFRICAN AMERICAN: 40 mL/min — AB (ref >60)
Glucose, Bld: 206 mg/dL — ABNORMAL HIGH (ref 70–99)
POTASSIUM: 3.9 mmol/L (ref 3.5–5.1)
SODIUM: 136 mmol/L (ref 135–145)

## 2018-08-23 MED ORDER — SODIUM CHLORIDE 0.9 % IV BOLUS
1000.0000 mL | Freq: Once | INTRAVENOUS | Status: AC
  Administered 2018-08-23: 1000 mL via INTRAVENOUS

## 2018-08-23 MED ORDER — ONDANSETRON HCL 4 MG/2ML IJ SOLN
4.0000 mg | Freq: Once | INTRAMUSCULAR | Status: AC
  Administered 2018-08-23: 4 mg via INTRAVENOUS
  Filled 2018-08-23: qty 2

## 2018-08-23 MED ORDER — SODIUM CHLORIDE 0.9 % IV BOLUS: 1000.0000 mL | Freq: Once | INTRAVENOUS | Status: DC

## 2018-08-23 NOTE — ED Provider Notes (Signed)
[facility] Emergency Department Provider Note  ____________________________________________  Time seen: Approximately 4:28 PM  I have reviewed the triage vital signs and the nursing notes.   HISTORY  Chief Complaint Weakness   HPI Regina Moreno is a 62 y.o. female with a history of diabetes and hypertension who presents for concerns of dehydration and generalized weakness.  Patient reports that she has been on a study drug for diabetes for the last 2 weeks.  She has had decreased appetite and over the last few days she has been feeling very weak and dizzy.  She has had several episodes of lightheadedness and feeling like she is going to pass out.  She has had intermittent diarrhea over the last 2 days and had a very large watery bowel movement this morning.  She felt very dizzy and almost passed out while she was in the toilet.  She saw her doctor 2 days ago and had labs done and she was called today and told to come to the emergency room for possible dehydration.  She denies dysuria or hematuria, abdominal pain, chest pain, shortness of breath, cough, fever.  She has informed her diabetes coordinator about the symptoms she is experienced while on this medication.  Past Medical History:  Diagnosis Date  . Diabetes mellitus without complication (HCC)   . Hypertension     Past Surgical History:  Procedure Laterality Date  . ABDOMINAL HYSTERECTOMY      Prior to Admission medications   Medication Sig Start Date End Date Taking? Authorizing Provider  bisoprolol (ZEBETA) 10 MG tablet Take 1 tablet by mouth daily.    [provider]  glipiZIDE-metformin (METAGLIP) 2.5-500 MG per tablet Take 2 tablets by mouth 2 (two) times daily.    [provider]  Liraglutide (VICTOZA) 18 MG/3ML SOPN Inject 1.8 mg into the skin every morning.    [provider]  losartan-hydrochlorothiazide (HYZAAR) 100-12.5 MG per tablet Take 1 tablet by mouth  daily.    [provider]  metoCLOPramide (REGLAN) 10 MG tablet Take 1 tablet (10 mg total) by mouth every 6 (six) hours as needed for nausea or vomiting. 05/21/15 05/20/16  Rebecka Apley, MD  omeprazole (PRILOSEC) 20 MG capsule Take 1 capsule by mouth daily.    [provider]    Allergies Ampicillin; Fenofibrate; Simvastatin; and Sulfa antibiotics  Family History  Problem Relation Age of Onset  . Breast cancer Neg Hx     Social History Social History   Tobacco Use  . Smoking status: Former Smoker    Types: Cigarettes  . Smokeless tobacco: Never Used  Substance Use Topics  . Alcohol use: No  . Drug use: No    Review of Systems  Constitutional: Negative for fever. + Lightheadedness and generalized weakness Eyes: Negative for visual changes. ENT: Negative for sore throat. Neck: No neck pain  Cardiovascular: Negative for chest pain. Respiratory: Negative for shortness of breath. Gastrointestinal: Negative for abdominal pain, vomiting. + diarrhea. Genitourinary: Negative for dysuria. Musculoskeletal: Negative for back pain. Skin: Negative for rash. Neurological: Negative for headaches, weakness or numbness. Psych: No SI or HI  ____________________________________________   PHYSICAL EXAM:  VITAL SIGNS: ED Triage Vitals  Enc Vitals Group     BP 08/23/18 1243 119/71     Pulse Rate 08/23/18 1243 (!) 111     Resp 08/23/18 1243 18     Temp 08/23/18 1244 98.1 F (36.7 C)     Temp Source 08/23/18 1244  Oral     SpO2 08/23/18 1243 97 %     Weight --      Height --      Head Circumference --      Peak Flow --      Pain Score --      Pain Loc --      Pain Edu? --      Excl. in GC? --     Constitutional: Alert and oriented. Well appearing and in no apparent distress. HEENT:      Head: Normocephalic and atraumatic.         Eyes: Conjunctivae are normal. Sclera is non-icteric.       Mouth/Throat: Mucous membranes are dry.       Neck: Supple  with no signs of meningismus. Cardiovascular: Tachycardic with regular. No murmurs, gallops, or rubs. 2+ symmetrical distal pulses are present in all extremities. No JVD. Respiratory: Normal respiratory effort. Lungs are clear to auscultation bilaterally. No wheezes, crackles, or rhonchi.  Gastrointestinal: Soft, non tender, and non distended with positive bowel sounds. No rebound or guarding. Genitourinary: No CVA tenderness. Musculoskeletal: Nontender with normal range of motion in all extremities. No edema, cyanosis, or erythema of extremities. Neurologic: Normal speech and language. Face is symmetric. Moving all extremities. No gross focal neurologic deficits are appreciated. Skin: Skin is warm, dry and intact. No rash noted. Psychiatric: Mood and affect are normal. Speech and behavior are normal.  ____________________________________________   LABS (all labs ordered are listed, but only abnormal results are displayed)  Labs Reviewed  BASIC METABOLIC PANEL - Abnormal; Notable for the following components:      Result Value   Glucose, Bld 206 (*)    BUN 27 (*)    Creatinine, Ser 1.37 (*)    GFR calc non Af Amer 40 (*)    GFR calc Af Amer 47 (*)    All other components within normal limits  URINALYSIS, COMPLETE (UACMP) WITH MICROSCOPIC - Abnormal; Notable for the following components:   Color, Urine YELLOW (*)    APPearance CLEAR (*)    Glucose, UA >=500 (*)    Leukocytes, UA SMALL (*)    Bacteria, UA RARE (*)    All other components within normal limits  URINE CULTURE  CBC  CBG MONITORING, ED   ____________________________________________  EKG  ED ECG REPORT I, Nita Sickle, the attending physician, personally viewed and interpreted this ECG.  Sinus tachycardia, rate of 100, normal intervals, normal axis, no ST elevations or depressions, Q waves on anterior leads and T wave inversion in aVL.  unchanged from  prior ____________________________________________  RADIOLOGY  none  ____________________________________________   PROCEDURES  Procedure(s) performed: None Procedures Critical Care performed:  None ____________________________________________   INITIAL IMPRESSION / ASSESSMENT AND PLAN / ED COURSE  62 y.o. female with a history of diabetes and hypertension who presents for concerns of dehydration and generalized weakness, lightheadedness in the setting of being started 2 weeks ago on a new diabetic study medicine. She has had nausea and diarrhea. Patient looks dry on exam, tachycardic, dry mucous membranes.  EKG with no evidence of dysrhythmias or ischemia.  Labs showing mildly elevated creatinine of 1.37 (baseline is 1). Will give IVF and check UA to rule out UTI. Most likely mild dehydration caused by the medicine.   Clinical Course as of Aug 24 2111  Tue Aug 23, 2018  2111 Patient received 2 L of fluid.  No longer orthostatic.  Feeling markedly improved.  Will discharge home with close follow-up with primary care doctor for repeat lab work.  Discussed return precautions for any further episodes of diarrhea, dizziness, or any episodes of chest pain or shortness of breath.  Discussed the importance of talking to her primary care doctor about this research medication that she is on which seems to be causing her symptoms.   [CV]    Clinical Course User Index [CV] Don Perking [place], MD     As part of my medical decision making, I reviewed the following data within the electronic MEDICAL RECORD NUMBER Nursing notes reviewed and incorporated, Labs reviewed , EKG interpreted , Old EKG reviewed, Old chart reviewed, Notes from prior ED visits and [place] Controlled Substance Database    Pertinent labs & imaging results that were available during my care of the patient were reviewed by me and considered in my medical decision making (see chart for  details).    ____________________________________________   FINAL CLINICAL IMPRESSION(S) / ED DIAGNOSES  Final diagnoses:  Dehydration  Orthostatic dizziness      NEW MEDICATIONS STARTED DURING THIS VISIT:  ED Discharge Orders    None       Note:  This document was prepared using Dragon voice recognition software and may include unintentional dictation errors.    Nita Sickle, MD 08/23/18 2113

## 2018-08-23 NOTE — ED Triage Notes (Signed)
Pt states she is dehydrated since Saturday. States saw [facility] d/t being on a study medicine (2 weeks ago started it) and had blood drawn. Told dehydrated from blood work. Has drank 120oz gatorade since yesterday. Multiple complaints: weakness, dehydration, nausea, dizzy, diarrhea x 1 hr.

## 2018-08-23 NOTE — ED Notes (Signed)
Pt to POV without difficulty. VSS. NAD. Discharge instructions, and follow up discussed. All questions and concerns addressed.

## 2018-08-25 LAB — URINE CULTURE: Culture: 10000 — AB

## 2018-09-08 ENCOUNTER — Observation Stay
Admission: EM | Admit: 2018-09-08 | Discharge: 2018-09-09 | Disposition: A | Discharge status: Home / Self Care | Payer: BLUE CROSS/BLUE SHIELD | Attending: Internal Medicine | Admitting: Internal Medicine

## 2018-09-08 ENCOUNTER — Emergency Department: Payer: BLUE CROSS/BLUE SHIELD

## 2018-09-08 ENCOUNTER — Other Ambulatory Visit: Payer: Self-pay

## 2018-09-08 ENCOUNTER — Encounter: Payer: Self-pay | Admitting: Emergency Medicine

## 2018-09-08 DIAGNOSIS — E785 Hyperlipidemia, unspecified: Secondary | ICD-10-CM | POA: Insufficient documentation

## 2018-09-08 DIAGNOSIS — E119 Type 2 diabetes mellitus without complications: Secondary | ICD-10-CM | POA: Diagnosis not present

## 2018-09-08 DIAGNOSIS — Z880 Allergy status to penicillin: Secondary | ICD-10-CM | POA: Diagnosis not present

## 2018-09-08 DIAGNOSIS — K219 Gastro-esophageal reflux disease without esophagitis: Secondary | ICD-10-CM | POA: Diagnosis not present

## 2018-09-08 DIAGNOSIS — Z7984 Long term (current) use of oral hypoglycemic drugs: Secondary | ICD-10-CM | POA: Diagnosis not present

## 2018-09-08 DIAGNOSIS — I639 Cerebral infarction, unspecified: Secondary | ICD-10-CM

## 2018-09-08 DIAGNOSIS — Z79899 Other long term (current) drug therapy: Secondary | ICD-10-CM | POA: Diagnosis not present

## 2018-09-08 DIAGNOSIS — I10 Essential (primary) hypertension: Secondary | ICD-10-CM | POA: Diagnosis not present

## 2018-09-08 DIAGNOSIS — Z87891 Personal history of nicotine dependence: Secondary | ICD-10-CM | POA: Diagnosis not present

## 2018-09-08 DIAGNOSIS — Z882 Allergy status to sulfonamides status: Secondary | ICD-10-CM | POA: Insufficient documentation

## 2018-09-08 DIAGNOSIS — R4189 Other symptoms and signs involving cognitive functions and awareness: Secondary | ICD-10-CM

## 2018-09-08 DIAGNOSIS — R29701 NIHSS score 1: Secondary | ICD-10-CM | POA: Insufficient documentation

## 2018-09-08 DIAGNOSIS — R200 Anesthesia of skin: Secondary | ICD-10-CM | POA: Diagnosis not present

## 2018-09-08 DIAGNOSIS — R449 Unspecified symptoms and signs involving general sensations and perceptions: Secondary | ICD-10-CM

## 2018-09-08 LAB — COMPREHENSIVE METABOLIC PANEL
ALBUMIN: 4.3 g/dL (ref 3.5–5.0)
ALT: 23 U/L (ref 0–44)
ANION GAP: 16 — AB (ref 5–15)
AST: 23 U/L (ref 15–41)
Alkaline Phosphatase: 77 U/L (ref 38–126)
BILIRUBIN TOTAL: 0.6 mg/dL (ref 0.3–1.2)
BUN: 23 mg/dL (ref 8–23)
CO2: 26 mmol/L (ref 22–32)
Calcium: 9.2 mg/dL (ref 8.9–10.3)
Chloride: 98 mmol/L (ref 98–111)
Creatinine, Ser: 1.04 mg/dL — ABNORMAL HIGH (ref 0.44–1.00)
GFR, EST NON AFRICAN AMERICAN: 56 mL/min — AB (ref >60)
Glucose, Bld: 208 mg/dL — ABNORMAL HIGH (ref 70–99)
POTASSIUM: 3.7 mmol/L (ref 3.5–5.1)
Sodium: 140 mmol/L (ref 135–145)
TOTAL PROTEIN: 7.9 g/dL (ref 6.5–8.1)

## 2018-09-08 LAB — GLUCOSE, CAPILLARY
Glucose-Capillary: 192 mg/dL — ABNORMAL HIGH (ref 70–99)
Glucose-Capillary: 202 mg/dL — ABNORMAL HIGH (ref 70–99)

## 2018-09-08 LAB — APTT: aPTT: 28 seconds (ref 24–36)

## 2018-09-08 LAB — CBC
HCT: 42.8 % (ref 36.0–46.0)
Hemoglobin: 14.1 g/dL (ref 12.0–15.0)
MCH: 30.4 pg (ref 26.0–34.0)
MCHC: 32.9 g/dL (ref 30.0–36.0)
MCV: 92.2 fL (ref 80.0–100.0)
NRBC: 0 % (ref 0.0–0.2)
PLATELETS: 301 10*3/uL (ref 150–400)
RBC: 4.64 MIL/uL (ref 3.87–5.11)
RDW: 13.2 % (ref 11.5–15.5)
WBC: 9.1 10*3/uL (ref 4.0–10.5)

## 2018-09-08 LAB — DIFFERENTIAL
Abs Immature Granulocytes: 0.03 10*3/uL (ref 0.00–0.07)
Basophils Absolute: 0.1 10*3/uL (ref 0.0–0.1)
Basophils Relative: 1 %
EOS ABS: 0.7 10*3/uL — AB (ref 0.0–0.5)
EOS PCT: 8 %
IMMATURE GRANULOCYTES: 0 %
LYMPHS ABS: 3.7 10*3/uL (ref 0.7–4.0)
Lymphocytes Relative: 41 %
MONO ABS: 0.4 10*3/uL (ref 0.1–1.0)
MONOS PCT: 5 %
Neutro Abs: 4.1 10*3/uL (ref 1.7–7.7)
Neutrophils Relative %: 45 %

## 2018-09-08 LAB — PROTIME-INR
INR: 0.84
PROTHROMBIN TIME: 11.4 s (ref 11.4–15.2)

## 2018-09-08 LAB — TROPONIN I

## 2018-09-08 MED ORDER — STROKE: EARLY STAGES OF RECOVERY BOOK
Freq: Once | Status: AC
  Administered 2018-09-08: 20:00:00

## 2018-09-08 MED ORDER — INSULIN ASPART 100 UNIT/ML ~~LOC~~ SOLN
0.0000 [IU] | Freq: Three times a day (TID) | SUBCUTANEOUS | Status: DC
  Administered 2018-09-09 (×2): 2 [IU] via SUBCUTANEOUS
  Filled 2018-09-08 (×2): qty 1

## 2018-09-08 MED ORDER — CANAGLIFLOZIN 100 MG PO TABS
100.0000 mg | ORAL_TABLET | Freq: Every day | ORAL | Status: DC
  Filled 2018-09-08: qty 1

## 2018-09-08 MED ORDER — PRAVASTATIN SODIUM 20 MG PO TABS
10.0000 mg | ORAL_TABLET | ORAL | Status: DC
  Administered 2018-09-09: 09:00:00 10 mg via ORAL
  Filled 2018-09-08: qty 1

## 2018-09-08 MED ORDER — IRBESARTAN 150 MG PO TABS
300.0000 mg | ORAL_TABLET | Freq: Every day | ORAL | Status: DC
  Administered 2018-09-09: 09:00:00 300 mg via ORAL
  Filled 2018-09-08: qty 2

## 2018-09-08 MED ORDER — HYDROCHLOROTHIAZIDE 12.5 MG PO CAPS
12.5000 mg | ORAL_CAPSULE | Freq: Every day | ORAL | Status: DC
  Administered 2018-09-09: 09:00:00 12.5 mg via ORAL
  Filled 2018-09-08: qty 1

## 2018-09-08 MED ORDER — METFORMIN HCL 500 MG PO TABS
500.0000 mg | ORAL_TABLET | Freq: Two times a day (BID) | ORAL | Status: DC
  Administered 2018-09-08 – 2018-09-09 (×2): 500 mg via ORAL
  Filled 2018-09-08 (×3): qty 1

## 2018-09-08 MED ORDER — GLIPIZIDE-METFORMIN HCL 2.5-500 MG PO TABS: 2.0000 | ORAL_TABLET | Freq: Two times a day (BID) | ORAL | Status: DC

## 2018-09-08 MED ORDER — GLIPIZIDE 5 MG PO TABS
2.5000 mg | ORAL_TABLET | Freq: Two times a day (BID) | ORAL | Status: DC
  Administered 2018-09-08 – 2018-09-09 (×2): 2.5 mg via ORAL
  Filled 2018-09-08 (×3): qty 0.5

## 2018-09-08 MED ORDER — CYCLOBENZAPRINE HCL 10 MG PO TABS: 5.0000 mg | ORAL_TABLET | Freq: Three times a day (TID) | ORAL | Status: DC | PRN

## 2018-09-08 MED ORDER — ASPIRIN 300 MG RE SUPP: 300.0000 mg | Freq: Every day | RECTAL | Status: DC

## 2018-09-08 MED ORDER — ACETAMINOPHEN 325 MG PO TABS: 650.0000 mg | ORAL_TABLET | ORAL | Status: DC | PRN

## 2018-09-08 MED ORDER — PANTOPRAZOLE SODIUM 40 MG PO TBEC
40.0000 mg | DELAYED_RELEASE_TABLET | Freq: Every day | ORAL | Status: DC
  Administered 2018-09-09: 40 mg via ORAL
  Filled 2018-09-08: qty 1

## 2018-09-08 MED ORDER — ASPIRIN 325 MG PO TABS
325.0000 mg | ORAL_TABLET | Freq: Every day | ORAL | Status: DC
  Administered 2018-09-09: 325 mg via ORAL
  Filled 2018-09-08: qty 1

## 2018-09-08 MED ORDER — ACETAMINOPHEN 650 MG RE SUPP: 650.0000 mg | RECTAL | Status: DC | PRN

## 2018-09-08 MED ORDER — ENOXAPARIN SODIUM 40 MG/0.4ML ~~LOC~~ SOLN
40.0000 mg | SUBCUTANEOUS | Status: DC
  Administered 2018-09-08: 40 mg via SUBCUTANEOUS
  Filled 2018-09-08: qty 0.4

## 2018-09-08 MED ORDER — ALBUTEROL SULFATE (2.5 MG/3ML) 0.083% IN NEBU: 2.5000 mg | INHALATION_SOLUTION | Freq: Four times a day (QID) | RESPIRATORY_TRACT | Status: DC | PRN

## 2018-09-08 MED ORDER — ACETAMINOPHEN 160 MG/5ML PO SOLN
650.0000 mg | ORAL | Status: DC | PRN
  Filled 2018-09-08: qty 20.3

## 2018-09-08 MED ORDER — OLMESARTAN MEDOXOMIL-HCTZ 40-12.5 MG PO TABS: 1.0000 | ORAL_TABLET | Freq: Every day | ORAL | Status: DC

## 2018-09-08 MED ORDER — ASPIRIN 81 MG PO CHEW
162.0000 mg | CHEWABLE_TABLET | Freq: Once | ORAL | Status: AC
  Administered 2018-09-08: 162 mg via ORAL
  Filled 2018-09-08: qty 2

## 2018-09-08 NOTE — ED Provider Notes (Signed)
[facility] Emergency Department Provider Note ____________________________________________   First MD Initiated Contact with Patient 09/08/18 1614     (approximate)  I have reviewed the triage vital signs and the nursing notes.   HISTORY  Chief Complaint Numbness    HPI Regina Moreno is a 62 y.o. female with PMH as noted below who presents with numbness, acute onset 1 hour prior to arrival (the patient states a few minutes before 3 PM), involving the whole right side of her body including the face, arm, and leg, now somewhat improving although not resolved.  The patient states that the numbness caused her to stumble, but she denies any weakness or ataxia.  She denies vision changes or speech disturbance.  The patient states that she was recently put on an experimental medication for her diabetes which caused her to be dehydrated.  She was seen in the ED a few weeks ago with dehydration and states that she had somewhat similar numbness among multiple other symptoms, however it was not this sudden in onset or unilateral.   Past Medical History:  Diagnosis Date  . Diabetes mellitus without complication (HCC)   . Hypertension     There are no active problems to display for this patient.   Past Surgical History:  Procedure Laterality Date  . ABDOMINAL HYSTERECTOMY      Prior to Admission medications   Medication Sig Start Date End Date Taking? Authorizing Provider  albuterol (PROVENTIL HFA;VENTOLIN HFA) 108 (90 Base) MCG/ACT inhaler Inhale 2 puffs into the lungs every 6 (six) hours as needed for wheezing. 03/11/18 03/11/19 Yes [provider]  cyclobenzaprine (FLEXERIL) 5 MG tablet Take 5 mg by mouth 3 (three) times daily as needed for muscle spasms. 08/08/18  Yes [provider]  glipiZIDE-metformin (METAGLIP) 2.5-500 MG per tablet Take 2 tablets by mouth 2 (two) times daily.   Yes [provider]  JARDIANCE 25 MG TABS tablet  Take 25 mg by mouth daily. 08/19/18  Yes [provider]  olmesartan-hydrochlorothiazide (BENICAR HCT) 40-12.5 MG tablet Take 1 tablet by mouth daily. 08/12/18  Yes [provider]  omeprazole (PRILOSEC) 20 MG capsule Take 1 capsule by mouth daily.   Yes [provider]  pravastatin (PRAVACHOL) 10 MG tablet Take 10 mg by mouth every Monday, Wednesday, and Friday. 06/15/18  Yes [provider]  vitamin C (ASCORBIC ACID) 500 MG tablet Take 1,000 mg by mouth daily.   Yes [provider]    Allergies Ampicillin; Fenofibrate; Simvastatin; and Sulfa antibiotics  Family History  Problem Relation Age of Onset  . Breast cancer Neg Hx     Social History Social History   Tobacco Use  . Smoking status: Former Smoker    Types: Cigarettes  . Smokeless tobacco: Never Used  Substance Use Topics  . Alcohol use: No  . Drug use: No    Review of Systems  Constitutional: No fever. Eyes: No visual changes. ENT: No neck pain. Cardiovascular: Denies chest pain. Respiratory: Denies shortness of breath. Gastrointestinal: No vomiting.  Genitourinary: Negative for dysuria.  Musculoskeletal: Negative for back pain. Skin: Negative for rash. Neurological: Negative for headaches.  Negative for weakness.  Positive for right-sided numbness.   ____________________________________________   PHYSICAL EXAM:  VITAL SIGNS: ED Triage Vitals  Enc Vitals Group     BP 09/08/18 1607 (!) 146/76     Pulse Rate 09/08/18 1607 90     Resp 09/08/18 1607 16     Temp  09/08/18 1607 97.6 F (36.4 C)     Temp Source 09/08/18 1607 Oral     SpO2 09/08/18 1607 100 %     Weight 09/08/18 1621 179 lb 0.2 oz (81.2 kg)     Height 09/08/18 1621 5\' 6"  (1.676 m)     Head Circumference --      Peak Flow --      Pain Score 09/08/18 1620 0     Pain Loc --      Pain Edu? --      Excl. in GC? --     Constitutional: Alert and oriented. Well appearing and in no acute  distress. Eyes: Conjunctivae are normal.  EOMI.  PERRLA. Head: Atraumatic. Nose: No congestion/rhinnorhea. Mouth/Throat: Mucous membranes are moist.   Neck: Normal range of motion.  Cardiovascular: Good peripheral circulation. Respiratory: Normal respiratory effort.  Gastrointestinal: No distention.  Musculoskeletal: No lower extremity edema.  Extremities warm and well perfused.  Neurologic:  Normal speech and language.  5/5 motor strength intact in all extremities.  Subjective decreased sensation right side.  No ataxia.  No facial droop.  Skin:  Skin is warm and dry. No rash noted. Psychiatric: Mood and affect are normal. Speech and behavior are normal.  ____________________________________________   LABS (all labs ordered are listed, but only abnormal results are displayed)  Labs Reviewed  GLUCOSE, CAPILLARY - Abnormal; Notable for the following components:      Result Value   Glucose-Capillary 192 (*)    All other components within normal limits  DIFFERENTIAL - Abnormal; Notable for the following components:   Eosinophils Absolute 0.7 (*)    All other components within normal limits  COMPREHENSIVE METABOLIC PANEL - Abnormal; Notable for the following components:   Glucose, Bld 208 (*)    Creatinine, Ser 1.04 (*)    GFR calc non Af Amer 56 (*)    Anion gap 16 (*)    All other components within normal limits  PROTIME-INR  APTT  CBC  TROPONIN I  CBG MONITORING, ED   ____________________________________________  EKG  ED ECG REPORT I, Dionne Bucy, the attending physician, personally viewed and interpreted this ECG.  Date: 09/08/2018 EKG Time: 1624 Rate: 89 Rhythm: normal sinus rhythm QRS Axis: normal Intervals: normal ST/T Wave abnormalities: normal Narrative Interpretation: no evidence of acute ischemia  ____________________________________________  RADIOLOGY  CT head: No ICH or acute  stroke  ____________________________________________   PROCEDURES  Procedure(s) performed: No  Procedures  Critical Care performed: Yes  CRITICAL CARE Performed by: Dionne Bucy   Total critical care time: 30 minutes  Critical care time was exclusive of separately billable procedures and treating other patients.  Critical care was necessary to treat or prevent imminent or life-threatening deterioration.  Critical care was time spent personally by me on the following activities: development of treatment plan with patient and/or surrogate as well as nursing, discussions with consultants, evaluation of patient's response to treatment, examination of patient, obtaining history from patient or surrogate, ordering and performing treatments and interventions, ordering and review of laboratory studies, ordering and review of radiographic studies, pulse oximetry and re-evaluation of patient's condition. ____________________________________________   INITIAL IMPRESSION / ASSESSMENT AND PLAN / ED COURSE  Pertinent labs & imaging results that were available during my care of the patient were reviewed by me and considered in my medical decision making (see chart for details).  62 year old female with PMH as noted above presents with acute onset of right-sided decreased sensation and tingling, with no  associated motor symptoms.  She does report a prior history of numbness related to dehydration but not sudden onset or unilateral.  On exam, the patient has subjective decreased sensation to the right face, arm, and leg, but no motor deficits or other neuro deficits on exam.  The remainder of the exam is unremarkable.  Given the unilateral symptoms and the concern for acute stroke, the patient was made a code stroke and sent for emergent CT.  CT is negative.  She was evaluated by the stroke tele-neurologist who I consulted with and who does advises based on discussion with the patient to  withhold tPA at this time.  The patient's symptoms appear to be resolving.  We will obtain labs and plan for admission for MRI and stroke work-up as per neurology recommendations.  ----------------------------------------- 6:02 PM on 09/08/2018 -----------------------------------------  The lab work-up is unremarkable.  The patient continues to have waxing and waning sensory symptoms with no new symptoms.  Based on recommendations of tele-neurology, I will proceed with admission.  I signed the patient out to the hospitalist. ____________________________________________   FINAL CLINICAL IMPRESSION(S) / ED DIAGNOSES  Final diagnoses:  Sensory deficit, right      NEW MEDICATIONS STARTED DURING THIS VISIT:  New Prescriptions   No medications on file     Note:  This document was prepared using Dragon voice recognition software and may include unintentional dictation errors.    Dionne Bucy, MD 09/08/18 [phone]

## 2018-09-08 NOTE — ED Triage Notes (Signed)
PT arrive with concerns over sudden onset of right sided numbness that started at 1455.

## 2018-09-08 NOTE — Consult Note (Signed)
[facility] TeleNeurology Consult Services   TeleStroke Metrics: LKW: 1455 Door Time: 1603  [facility] Contacted: 1616  [facility] at Bedside: 1626 NIHSS: 1631 Decision on Alteplase: Not to give as the patient's NIH stroke scale score is currently 1.  Patient was in agreement with not to receive IV alteplase. Interventional Candidate: Not a candidate as her symptoms are not consistent with a large vessel proximal occlusion.   Chief Complaint: Right-sided numbness   HPI: Asked to see this patient in emergent telemedicine consultation utilizing interactive audio and video technologies. ?Consultation was performed with assistance of ancillary / medical staff at bedside.   Verbal consent to perform the examination with telemedicine was obtained. Patient agreed to proceed with the consultation for acute stroke protocol.  62 year old right-handed white female who comes to the emergency room for evaluation of persistent right-sided numbness.  Patient does not take any aspirin at baseline.  She states recently she was admitted to the hospital at the end of October for episodes of dehydration.  She did admit to having some numbness and tingling during this timeframe, but this improved after drinking Gatorade and staying well-hydrated.  Patient states that sometime around 2:55 PM, she developed acute right hemibody numbness.  She tried to drink some water but her symptoms did not improve.  She had no associated chest pain, headaches, or focal weakness.  She called her primary care physician, and she was referred to the emergency room for further evaluation.  Currently, she reports very mild right face and arm numbness that is improving.  No focal weakness.  Head CT was negative.  I reviewed with the patient about the availability of IV alteplase.  I reviewed with her about some of the potential side effects of IV alteplase to include an approximate 6% risk of symptomatic intracranial  hemorrhage, internal bleeding, and/or angioedema.  I also reviewed with her about some of the potential benefits of IV alteplase to include an approximate 30% chance of improvement at 3 months with the medication versus an approximate 20% chance of improvement at 3 months without the medication.  At the end of the day, the patient was in agreement with not to receive IV alteplase and has opted for a more conservative route.   PMH: Diabetes mellitus and hypertension   SOC: Negative x3.  Patient is married.   FMH: Positive for stroke.   ROS: 13 point review systems were reviewed with the patient, and are all negative with the exception of the aforementioned in the history of present illness.   VS: Temperature is 97.6 F, pulse 90, respirations 16, blood pressure 146/76, oxygen saturation 100%, weight 81.2 kg   Exam: Patient is in no apparent distress.  Patient appears as stated age.  No obvious acute respiratory or cardiac distress.  Patient is well groomed and well-nourished. 1a- LOC: Keenly responsive - 0     1b- LOC questions: Answers both questions correctly - 0    1c- LOC commands- Performs both tasks correctly- 0    2- Gaze: Normal; no gaze paresis or gaze deviation - 0    3- Visual Fields: normal, no Visual field deficit - 0    4- Facial movements: no facial palsy - 0    5- Upper limb motor - no drift - 0    6- Lower limb motor - no drift - 0     7- Limb Coordination: absent ataxia - 0     8- Sensory: right sensory loss - 1  9- Language - No aphasia - 0     10- Speech - No dysarthria -0    11- Neglect / Extinction - none found - 0   NIHSS score: 1    Diagnostic Data: CT of the head showed no acute intracranial process  Blood glucose 192   Medical Data Reviewed:   1.Data?reviewed include clinical labs, radiology,?and medical tests;   2.Tests?results discussed w/performing or interpreting physician;   3.Obtaining/reviewing old medical records;   4.Obtaining?case history from  another source;   5.Independent?review of image, tracing, or specimen.    Medical Decision Making:   - Extensive number of diagnosis or management options are considered below.   - Extensive amount of complex data reviewed.   - High risk of complication and/or morbidity or mortality are associated with differential diagnostic considerations below.   - There may be?uncertain?outcome and increased probability of prolonged functional impairment or high probability of severe prolonged functional impairment associated with some of these differential diagnosis.    Differential Diagnosis for Stroke:   1.?Cardioembolic?stroke   2. Small vessel disease/lacune   3. Thromboembolic, artery-to-artery mechanism   4.?Hypercoagulable?state-related infarct   5. Transient ischemic attack   6. Thrombotic mechanism, large artery disease    Assessment: 1.  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  Hypertension 3.  Diabetes Mellitus  Recommendations: Patient can be admitted to the hospital for further stroke work-up.   Allow permissive hypertension. Start the patient on a baby aspirin. Consult local neurology team to assist with evaluation and management. Check MRI brain without contrast to rule out any acute intracranial process. Check MRA of the head and neck to evaluate her intracranial and extracranial blood vessels. Check hemoglobin A1c and lipid panel. Check echocardiogram to gauge her cardiac function. Maintain the patient on telemetry to look for paroxysmal atrial fibrillation. Consult PT, OT, and ST. Continue supportive care. Plan of care was discussed with the patient.    Thank you for allowing [facility] to participate in the care of your patient. Please call me, Dr. Adrienne Mocha, with any questions at [phone]. Case discussed with the ER staff and Dr. Marisa Severin.   Critical Care notation:   I was called to see this critical patient emergently. I personally evaluated this  critical patient for acute stroke evaluation, and determining their eligibility for IV Alteplase and interventional therapies.  I have spent approximately 9 minutes with the patient, including time at bedside, time discussing the case with other physicians, reviewing plan of care, and time independently reviewing the records and scans.

## 2018-09-08 NOTE — H&P (Signed)
[employer] - [place] [facility]   PATIENT NAME: Regina Moreno    MR#:  [id number]  DATE OF BIRTH:  November 02, 1956  DATE OF ADMISSION:  09/08/2018  PRIMARY CARE PHYSICIAN: Lauro Regulus, MD   REQUESTING/REFERRING PHYSICIAN: Dionne Bucy, MD  CHIEF COMPLAINT:   Chief Complaint  Patient presents with  . Numbness    HISTORY OF PRESENT ILLNESS: Regina Moreno  is a 62 y.o. female with a known history of diabetes type 2 and hypertension who was recently hospitalized for dehydration who is presenting to the hospital with complaint of right side of her body being numb.  States the symptoms started around 3 PM and continued to persist.  Now her symptoms are coming and going.  Currently she is not feeling that numbness.  She states that the right side of her face as well as upper extremity and lower extremity are all numb.  He does not have any visual difficulties.  She states that when she had the numbness she felt unsteady on her feet.  Evaluation in the ED CT scan of the head was negative. PAST MEDICAL HISTORY:   Past Medical History:  Diagnosis Date  . Diabetes mellitus without complication (HCC)   . Hypertension     PAST SURGICAL HISTORY:  Past Surgical History:  Procedure Laterality Date  . ABDOMINAL HYSTERECTOMY      SOCIAL HISTORY:  Social History   Tobacco Use  . Smoking status: Former Smoker    Types: Cigarettes  . Smokeless tobacco: Never Used  Substance Use Topics  . Alcohol use: No    FAMILY HISTORY:  Family History  Problem Relation Age of Onset  . CAD Mother   . Diabetes Father   . Breast cancer Neg Hx     DRUG ALLERGIES:  Allergies  Allergen Reactions  . Ampicillin Nausea Only    Can take Augmentin  . Fenofibrate Nausea Only  . Simvastatin Nausea Only  . Sulfa Antibiotics Hives    Other reaction(s): Unknown    REVIEW OF SYSTEMS:   CONSTITUTIONAL: No fever, fatigue or weakness.  EYES: No blurred or double vision.   EARS, NOSE, AND THROAT: No tinnitus or ear pain.  RESPIRATORY: No cough, shortness of breath, wheezing or hemoptysis.  CARDIOVASCULAR: No chest pain, orthopnea, edema.  GASTROINTESTINAL: No nausea, vomiting, diarrhea or abdominal pain.  GENITOURINARY: No dysuria, hematuria.  ENDOCRINE: No polyuria, nocturia,  HEMATOLOGY: No anemia, easy bruising or bleeding SKIN: No rash or lesion. MUSCULOSKELETAL: No joint pain or arthritis.   NEUROLOGIC: Right upper extremity right lower extremity and right face numbness PSYCHIATRY: No anxiety or depression.   MEDICATIONS AT HOME:  Prior to Admission medications   Medication Sig Start Date End Date Taking? Authorizing Provider  albuterol (PROVENTIL HFA;VENTOLIN HFA) 108 (90 Base) MCG/ACT inhaler Inhale 2 puffs into the lungs every 6 (six) hours as needed for wheezing. 03/11/18 03/11/19 Yes [provider]  cyclobenzaprine (FLEXERIL) 5 MG tablet Take 5 mg by mouth 3 (three) times daily as needed for muscle spasms. 08/08/18  Yes [provider]  glipiZIDE-metformin (METAGLIP) 2.5-500 MG per tablet Take 2 tablets by mouth 2 (two) times daily.   Yes [provider]  JARDIANCE 25 MG TABS tablet Take 25 mg by mouth daily. 08/19/18  Yes [provider]  olmesartan-hydrochlorothiazide (BENICAR HCT) 40-12.5 MG tablet Take 1 tablet by mouth daily. 08/12/18  Yes [provider]  omeprazole (PRILOSEC) 20 MG capsule Take 1 capsule by mouth daily.  Yes [provider]  pravastatin (PRAVACHOL) 10 MG tablet Take 10 mg by mouth every Monday, Wednesday, and Friday. 06/15/18  Yes [provider]  vitamin C (ASCORBIC ACID) 500 MG tablet Take 1,000 mg by mouth daily.   Yes [provider]      PHYSICAL EXAMINATION:   VITAL SIGNS: Blood pressure (!) 148/73, pulse 75, temperature 97.8 F (36.6 C), temperature source Oral, resp. rate 18, height 5\' 6"  (1.676 m), weight 81.2 kg, SpO2 93 %.  GENERAL:  62  y.o.-year-old patient lying in the bed with no acute distress.  EYES: Pupils equal, round, reactive to light and accommodation. No scleral icterus. Extraocular muscles intact.  HEENT: Head atraumatic, normocephalic. Oropharynx and nasopharynx clear.  NECK:  Supple, no jugular venous distention. No thyroid enlargement, no tenderness.  LUNGS: Normal breath sounds bilaterally, no wheezing, rales,rhonchi or crepitation. No use of accessory muscles of respiration.  CARDIOVASCULAR: S1, S2 normal. No murmurs, rubs, or gallops.  ABDOMEN: Soft, nontender, nondistended. Bowel sounds present. No organomegaly or mass.  EXTREMITIES: No pedal edema, cyanosis, or clubbing.  NEUROLOGIC: Cranial nerves II through XII are intact. Muscle strength 5/5 in all extremities. Sensation intact. Gait not checked.  PSYCHIATRIC: The patient is alert and oriented x 3.  SKIN: No obvious rash, lesion, or ulcer.   LABORATORY PANEL:   CBC Recent Labs  Lab 09/08/18 1615  WBC 9.1  HGB 14.1  HCT 42.8  PLT 301  MCV 92.2  MCH 30.4  MCHC 32.9  RDW 13.2  LYMPHSABS 3.7  MONOABS 0.4  EOSABS 0.7*  BASOSABS 0.1   ------------------------------------------------------------------------------------------------------------------  Chemistries  Recent Labs  Lab 09/08/18 1615  NA 140  K 3.7  CL 98  CO2 26  GLUCOSE 208*  BUN 23  CREATININE 1.04*  CALCIUM 9.2  AST 23  ALT 23  ALKPHOS 77  BILITOT 0.6   ------------------------------------------------------------------------------------------------------------------ estimated creatinine clearance is 60.3 mL/min (A) (by C-G formula based on SCr of 1.04 mg/dL (H)). ------------------------------------------------------------------------------------------------------------------ No results for input(s): TSH, T4TOTAL, T3FREE, THYROIDAB in the last 72 hours.  Invalid input(s): FREET3   Coagulation profile Recent Labs  Lab 09/08/18 1615  INR 0.84    ------------------------------------------------------------------------------------------------------------------- No results for input(s): DDIMER in the last 72 hours. -------------------------------------------------------------------------------------------------------------------  Cardiac Enzymes Recent Labs  Lab 09/08/18 1615  TROPONINI <0.03   ------------------------------------------------------------------------------------------------------------------ Invalid input(s): POCBNP  ---------------------------------------------------------------------------------------------------------------  Urinalysis    Component Value Date/Time   COLORURINE YELLOW (A) 08/23/2018 1246   APPEARANCEUR CLEAR (A) 08/23/2018 1246   APPEARANCEUR Hazy 09/12/2013 0739   LABSPEC 1.016 08/23/2018 1246   LABSPEC 1.019 09/12/2013 0739   PHURINE 5.0 08/23/2018 1246   GLUCOSEU >=500 (A) 08/23/2018 1246   GLUCOSEU Negative 09/12/2013 0739   HGBUR NEGATIVE 08/23/2018 1246   BILIRUBINUR NEGATIVE 08/23/2018 1246   BILIRUBINUR Negative 09/12/2013 0739   KETONESUR NEGATIVE 08/23/2018 1246   PROTEINUR NEGATIVE 08/23/2018 1246   NITRITE NEGATIVE 08/23/2018 1246   LEUKOCYTESUR SMALL (A) 08/23/2018 1246   LEUKOCYTESUR Negative 09/12/2013 0739     RADIOLOGY: Ct Head Code Stroke Wo Contrast  Result Date: 09/08/2018 CLINICAL DATA:  Code stroke.  RIGHT-sided numbness. EXAM: CT HEAD WITHOUT CONTRAST TECHNIQUE: Contiguous axial images were obtained from the base of the skull through the vertex without intravenous contrast. COMPARISON:  None. FINDINGS: BRAIN: No intraparenchymal hemorrhage, mass effect nor midline shift. The ventricles and sulci are normal for age. No acute large vascular territory infarcts. No abnormal extra-axial fluid collections. Small midline posterior fossa arachnoid cyst. Basal  cisterns are patent. VASCULAR: Mild calcific atherosclerosis of the carotid siphons. SKULL: No skull fracture.  No significant scalp soft tissue swelling. RIGHT frontal tricholemmal cyst. SINUSES/ORBITS: Trace paranasal sinus mucosal thickening. Mastoid air cells are well aerated.The included ocular globes and orbital contents are non-suspicious. OTHER: None. ASPECTS [facility] Stroke Program Early CT Score) - Ganglionic level infarction (caudate, lentiform nuclei, internal capsule, insula, M1-M3 cortex): 7 - Supraganglionic infarction (M4-M6 cortex): 3 Total score (0-10 with 10 being normal): 10 IMPRESSION: 1. Normal CT HEAD without contrast for age. 2. ASPECTS is 10. 3. Critical Value/emergent results were called by telephone at the time of interpretation on 09/08/2018 at 4:23 pm to Dr. Dionne Bucy , who verbally acknowledged these results. Electronically Signed   By: Awilda Metro M.D.   On: 09/08/2018 16:25    EKG: Orders placed or performed during the hospital encounter of 09/08/18  . ED EKG  . ED EKG    IMPRESSION AND PLAN: Patient is 62 year old presenting with right upper extremity right lower extremity numbness  1.  Right upper extremity right lower extremity numbness possibly due to CVA Will obtain MRI of the brain MRA of the brain Carotid Doppler Echocardiogram of the heart Neurology consult Treat with aspirin Lipid panel in the morning  2.  Hypertension we will monitor blood pressure for now in light of strokelike symptoms  3.  Diabetes type 2 Continue Home Regimen Place on sliding scale insulin  4.  Hyperlipidemia continue Pravachol  5.  GERD continue omeprazole  6.  Miscellaneous Lovenox for DVT prophylaxis   All the records are reviewed and case discussed with ED provider. Management plans discussed with the patient, family and they are in agreement.  CODE STATUS: Full   TOTAL TIME TAKING CARE OF THIS PATIENT: 55 minutes.    Auburn Bilberry M.D on 09/08/2018 at 9:41 PM  Between 7am to 6pm - Pager - [phone]  After 6pm go to [email or web address] - password [email or web address]  [employer] Office  [phone]  CC: Primary care physician; Lauro Regulus, MD

## 2018-09-08 NOTE — ED Notes (Signed)
Pt resting on stretcher alert and talkative. No distress noted. Pt denies pain. Pending admission.

## 2018-09-08 NOTE — Consult Note (Signed)
CODE STROKE- PHARMACY COMMUNICATION   Time CODE STROKE called/page received: 1615  Time response to CODE STROKE was made (in person or via phone): 1620  Time Stroke Kit retrieved from [place] (only if needed):No anticoagulation at this time  Name of Provider/Nurse contacted:Olivia  Past Medical History:  Diagnosis Date  . Diabetes mellitus without complication ([place])   . Hypertension    Prior to Admission medications   Medication Sig Start Date End Date Taking? Authorizing Provider  bisoprolol (ZEBETA) 10 MG tablet Take 1 tablet by mouth daily.    [provider]  glipiZIDE-metformin (METAGLIP) 2.5-500 MG per tablet Take 2 tablets by mouth 2 (two) times daily.    [provider]  Liraglutide (VICTOZA) 18 MG/3ML SOPN Inject 1.8 mg into the skin every morning.    [provider]  losartan-hydrochlorothiazide (HYZAAR) 100-12.5 MG per tablet Take 1 tablet by mouth daily.    [provider]  metoCLOPramide (REGLAN) 10 MG tablet Take 1 tablet (10 mg total) by mouth every 6 (six) hours as needed for nausea or vomiting. 05/21/15 05/20/16  Loney Hering, MD  omeprazole (PRILOSEC) 20 MG capsule Take 1 capsule by mouth daily.    [provider]    Lu Duffel ,PharmD Clinical Pharmacist  09/08/2018  4:16 PM

## 2018-09-08 NOTE — Code Documentation (Signed)
Pt arrives via POV with complaints of sudden right sided numbness that began at 1455, code stroke activated in triage, after CT pt taken to room 2, initial NIHSS 1 for sensory deficit on the right side, while being evaluated pt states improving symptoms, pt refused tPA, report off to [employer]

## 2018-09-08 NOTE — ED Notes (Signed)
ED secretary notified of code stroke in triage- going to room 2.

## 2018-09-08 NOTE — Progress Notes (Signed)
   09/08/18 1700  Clinical Encounter Type  Visited With Patient  Visit Type Code (Code Stroke)  Referral From Nurse  Recommendations Follow-up as needed.  Spiritual Encounters  Spiritual Needs Prayer  Stress Factors  Patient Stress Factors Health changes   Chaplain responded to a Code Stroke and encountered the patient receiving treatment. Chaplain checked in with the patient's nurses and learned that no family members were present. Chaplain offered silent prayer and pastoral presence.

## 2018-09-09 ENCOUNTER — Observation Stay: Payer: BLUE CROSS/BLUE SHIELD

## 2018-09-09 ENCOUNTER — Other Ambulatory Visit: Payer: Self-pay

## 2018-09-09 ENCOUNTER — Observation Stay (HOSPITAL_BASED_OUTPATIENT_CLINIC_OR_DEPARTMENT_OTHER)
Admit: 2018-09-09 | Discharge: 2018-09-09 | Disposition: A | Discharge status: Home / Self Care | Payer: BLUE CROSS/BLUE SHIELD | Attending: Neurology | Admitting: Neurology

## 2018-09-09 DIAGNOSIS — G459 Transient cerebral ischemic attack, unspecified: Secondary | ICD-10-CM

## 2018-09-09 DIAGNOSIS — I5030 Unspecified diastolic (congestive) heart failure: Secondary | ICD-10-CM | POA: Diagnosis not present

## 2018-09-09 LAB — GLUCOSE, CAPILLARY
Glucose-Capillary: 169 mg/dL — ABNORMAL HIGH (ref 70–99)
Glucose-Capillary: 175 mg/dL — ABNORMAL HIGH (ref 70–99)

## 2018-09-09 LAB — LIPID PANEL
CHOL/HDL RATIO: 7.2 ratio
Cholesterol: 259 mg/dL — ABNORMAL HIGH (ref 0–200)
HDL: 36 mg/dL — AB (ref >40)
LDL Cholesterol: UNDETERMINED mg/dL (ref 0–99)
Triglycerides: 555 mg/dL — ABNORMAL HIGH (ref <150)
VLDL: UNDETERMINED mg/dL (ref 0–40)

## 2018-09-09 LAB — HEMOGLOBIN A1C
HEMOGLOBIN A1C: 8.1 % — AB (ref 4.8–5.6)
MEAN PLASMA GLUCOSE: 185.77 mg/dL

## 2018-09-09 LAB — VITAMIN B12: VITAMIN B 12: 117 pg/mL — AB (ref 180–914)

## 2018-09-09 MED ORDER — CLOPIDOGREL BISULFATE 75 MG PO TABS: 75.0000 mg | ORAL_TABLET | Freq: Every day | ORAL | Status: DC

## 2018-09-09 MED ORDER — OMEGA-3-ACID ETHYL ESTERS 1 G PO CAPS: 1.0000 g | ORAL_CAPSULE | Freq: Two times a day (BID) | ORAL | 2 refills | Status: DC

## 2018-09-09 MED ORDER — ATORVASTATIN CALCIUM 40 MG PO TABS: 40.0000 mg | ORAL_TABLET | Freq: Every day | ORAL | 2 refills | Status: AC

## 2018-09-09 MED ORDER — ASPIRIN 81 MG PO TABS: 325.0000 mg | ORAL_TABLET | Freq: Every day | ORAL | 2 refills | Status: AC

## 2018-09-09 MED ORDER — IOPAMIDOL (ISOVUE-370) INJECTION 76%
75.0000 mL | Freq: Once | INTRAVENOUS | Status: AC | PRN
  Administered 2018-09-09: 11:00:00 75 mL via INTRAVENOUS

## 2018-09-09 MED ORDER — OMEGA-3-ACID ETHYL ESTERS 1 G PO CAPS: 1.0000 g | ORAL_CAPSULE | Freq: Two times a day (BID) | ORAL | Status: DC

## 2018-09-09 MED ORDER — CLOPIDOGREL BISULFATE 75 MG PO TABS: 75.0000 mg | ORAL_TABLET | Freq: Every day | ORAL | 2 refills | Status: AC

## 2018-09-09 MED ORDER — ATORVASTATIN CALCIUM 20 MG PO TABS: 40.0000 mg | ORAL_TABLET | Freq: Every day | ORAL | Status: DC

## 2018-09-09 NOTE — Evaluation (Signed)
Physical Therapy Evaluation Patient Details Name: Regina Moreno MRN: [id number] DOB: 1956/02/22 Today's Date: 09/09/2018   History of Present Illness  Pt is 62 yo female that presented to ED with complaints of R sided numbness, PMH of DM II, HTN, recent hospital admission for dehydration. CT of head negative for acute changes.  Clinical Impression  Patient A&Ox4 at start of session, family at bedside, no complaints of pain. Reported her RUE/RLE and r side of her face were still numb, comparable to admission. Pt stated prior to hospital stay independence with gait/ADL/IADLs, works part time, drives.  Upon assessment patient demonstrated bed mobility independently, transferred independently (commode transfer performed as well during session). Ambulated with supervision, decreased gait velocity, reached for support due to complaints of RLE numbness. No LOB noted throughout session. Patient with slightly decreased RUE and RLE strength compared to L, though WFLs. Light touch sensation intact. The patient would benefit from skilled PT to address changes in strength, balance, and gait to return patient to PLOF.    Follow Up Recommendations Outpatient PT    Equipment Recommendations  None recommended by PT;Other (comment)(Pt reports having SPC at home)    Recommendations for Other Services       Precautions / Restrictions Restrictions Weight Bearing Restrictions: No      Mobility  Bed Mobility Overal bed mobility: Independent                Transfers Overall transfer level: Independent Equipment used: None             General transfer comment: Patient does reach for support, complaints that she cannot fully feel where her R foot is.  Ambulation/Gait Ambulation/Gait assistance: Supervision Gait Distance (Feet): 35 Feet Assistive device: None   Gait velocity: decreased   General Gait Details: cautious steps, occasionally reaches for support, slow   Stairs             Wheelchair Mobility    Modified Rankin (Stroke Patients Only)       Balance Overall balance assessment: Mild deficits observed, not formally tested                                           Pertinent Vitals/Pain Pain Assessment: No/denies pain    Home Living Family/patient expects to be discharged to:: Private residence Living Arrangements: Spouse/significant other Available Help at Discharge: Family Type of Home: House Home Access: Stairs to enter Entrance Stairs-Rails: Dealer of Steps: 2 Home Layout: One level Home Equipment: Environmental consultant - 2 wheels;Bedside commode;Shower seat      Prior Function Level of Independence: Independent         Comments: works part time, Theatre stage manager   Dominant Hand: Right    Extremity/Trunk Assessment   Upper Extremity Assessment Upper Extremity Assessment: RUE deficits/detail;LUE deficits/detail RUE Deficits / Details: 4/5 RUE Sensation: WNL LUE Deficits / Details: 4+/5 LUE Sensation: WNL    Lower Extremity Assessment Lower Extremity Assessment: RLE deficits/detail;LLE deficits/detail RLE Deficits / Details: 4+/5 except hip flexio n4/5 RLE Sensation: WNL LLE Deficits / Details: 4+/5  LLE Sensation: WNL       Communication   Communication: No difficulties  Cognition Arousal/Alertness: Awake/alert Behavior During Therapy: WFL for tasks assessed/performed Overall Cognitive Status: Within Functional Limits for tasks assessed  General Comments      Exercises     Assessment/Plan    PT Assessment Patient needs continued PT services  PT Problem List Decreased strength;Decreased balance;Decreased mobility       PT Treatment Interventions DME instruction;Balance training;Gait training;Neuromuscular re-education;Functional mobility training;Therapeutic activities;Therapeutic exercise;Patient/family  education    PT Goals (Current goals can be found in the Care Plan section)  Acute Rehab PT Goals Patient Stated Goal: Patient would like her numbness to decreased PT Goal Formulation: With patient Time For Goal Achievement: 09/23/18 Potential to Achieve Goals: Fair Additional Goals Additional Goal #1: Patient will demonstrate SLS >10sec bilaterally without UE support to indicate decreased fall risk.    Frequency Min 2X/week   Barriers to discharge        Co-evaluation               AM-PAC PT "6 Clicks" Daily Activity  Outcome Measure Difficulty turning over in bed (including adjusting bedclothes, sheets and blankets)?: None Difficulty moving from lying on back to sitting on the side of the bed? : None Difficulty sitting down on and standing up from a chair with arms (e.g., wheelchair, bedside commode, etc,.)?: A Little Help needed moving to and from a bed to chair (including a wheelchair)?: None Help needed walking in hospital room?: A Little Help needed climbing 3-5 steps with a railing? : A Little 6 Click Score: 21    End of Session Equipment Utilized During Treatment: Gait belt Activity Tolerance: Patient tolerated treatment well Patient left: in bed;with family/visitor present;Other (comment)(neurology at bedside) Nurse Communication: Mobility status PT Visit Diagnosis: Unsteadiness on feet (R26.81);Muscle weakness (generalized) (M62.81)    Time: 1610-9604 PT Time Calculation (min) (ACUTE ONLY): 19 min   Charges:   PT Evaluation $PT Eval Low Complexity: 1 Low          Olga Coaster PT, DPT 10:21 AM,09/09/18 [phone]

## 2018-09-09 NOTE — Plan of Care (Signed)
  Problem: Education: Goal: Knowledge of disease or condition will improve Outcome: Progressing Goal: Knowledge of secondary prevention will improve Outcome: Progressing Goal: Knowledge of patient specific risk factors addressed and post discharge goals established will improve Outcome: Progressing   Problem: Coping: Goal: Will verbalize positive feelings about self Outcome: Progressing   Problem: Health Behavior/Discharge Planning: Goal: Ability to manage health-related needs will improve Outcome: Progressing   Problem: Self-Care: Goal: Ability to participate in self-care as condition permits will improve Outcome: Progressing Goal: Ability to communicate needs accurately will improve Outcome: Progressing   Problem: Nutrition: Goal: Risk of aspiration will decrease Outcome: Progressing   Problem: Ischemic Stroke/TIA Tissue Perfusion: Goal: Complications of ischemic stroke/TIA will be minimized Outcome: Progressing   Problem: Health Behavior/Discharge Planning: Goal: Ability to manage health-related needs will improve Outcome: Progressing   Problem: Clinical Measurements: Goal: Ability to maintain clinical measurements within normal limits will improve Outcome: Progressing Goal: Will remain free from infection Outcome: Progressing Goal: Respiratory complications will improve Outcome: Progressing   Problem: Activity: Goal: Risk for activity intolerance will decrease Outcome: Progressing   Problem: Coping: Goal: Level of anxiety will decrease Outcome: Progressing   Problem: Safety: Goal: Ability to remain free from injury will improve Outcome: Progressing

## 2018-09-09 NOTE — Progress Notes (Signed)
PT Cancellation Note  Patient Details Name: Regina Moreno MRN: [id number] DOB: 19-Oct-1956   Cancelled Treatment:    Reason Eval/Treat Not Completed: Other (comment)(Pt  requesting to finish breakfast first. Echo tech also in room requesting to perform procedure. PT will follow up as able.)   Olga Coaster PT, DPT 8:51 AM,09/09/18 [phone]

## 2018-09-09 NOTE — Consult Note (Signed)
Referring Physician: Imogene Burn    Chief Complaint: Right sided numbness  HPI: Regina Moreno is an 62 y.o. female with a history of DM and HTN who reports that at about 3p on yesterday had the acute onset of right sided numbness that included her face, arm and leg.  Symptoms persisted and she finally called her PCP who asked her to present to the ED.  Today the patient reports that her symptoms persist but are improved.   Initial NIHSS of 1.    Date last known well: Date: 09/08/2018 Time last known well: Time: 15:00 tPA Given: No: Patient refusal   Past Medical History:  Diagnosis Date  . Diabetes mellitus without complication (HCC)   . Hypertension     Past Surgical History:  Procedure Laterality Date  . ABDOMINAL HYSTERECTOMY      Family History  Problem Relation Age of Onset  . CAD Mother   . Diabetes Father   . Breast cancer Neg Hx    Social History:  reports that she has quit smoking. Her smoking use included cigarettes. She has never used smokeless tobacco. She reports that she does not drink alcohol or use drugs.  Allergies:  Allergies  Allergen Reactions  . Ampicillin Nausea Only    Can take Augmentin  . Fenofibrate Nausea Only  . Simvastatin Nausea Only  . Sulfa Antibiotics Hives    Other reaction(s): Unknown    Medications:  I have reviewed the patient's current medications. Prior to Admission:  Medications Prior to Admission  Medication Sig Dispense Refill Last Dose  . albuterol (PROVENTIL HFA;VENTOLIN HFA) 108 (90 Base) MCG/ACT inhaler Inhale 2 puffs into the lungs every 6 (six) hours as needed for wheezing.   Unknown at PRN  . cyclobenzaprine (FLEXERIL) 5 MG tablet Take 5 mg by mouth 3 (three) times daily as needed for muscle spasms.  0 Unknown at PRN  . glipiZIDE-metformin (METAGLIP) 2.5-500 MG per tablet Take 2 tablets by mouth 2 (two) times daily.   09/07/2018 at Unknown time  . JARDIANCE 25 MG TABS tablet Take 25 mg by mouth daily.  0 09/07/2018 at  Unknown time  . olmesartan-hydrochlorothiazide (BENICAR HCT) 40-12.5 MG tablet Take 1 tablet by mouth daily.  3 09/07/2018 at Unknown time  . omeprazole (PRILOSEC) 20 MG capsule Take 1 capsule by mouth daily.   09/07/2018 at Unknown time  . pravastatin (PRAVACHOL) 10 MG tablet Take 10 mg by mouth every Monday, Wednesday, and Friday.  11 09/07/2018 at Unknown time  . vitamin C (ASCORBIC ACID) 500 MG tablet Take 1,000 mg by mouth daily.   09/07/2018 at Unknown time   Scheduled: . aspirin  300 mg Rectal Daily   Or  . aspirin  325 mg Oral Daily  . atorvastatin  40 mg Oral q1800  . enoxaparin (LOVENOX) injection  40 mg Subcutaneous Q24H  . glipiZIDE  2.5 mg Oral BID WC   And  . metFORMIN  500 mg Oral BID WC  . irbesartan  300 mg Oral Daily   And  . hydrochlorothiazide  12.5 mg Oral Daily  . insulin aspart  0-9 Units Subcutaneous TID WC  . pantoprazole  40 mg Oral Daily    ROS: History obtained from the patient  General ROS: negative for - chills, fatigue, fever, night sweats, weight gain or weight loss Psychological ROS: negative for - behavioral disorder, hallucinations, memory difficulties, mood swings or suicidal ideation Ophthalmic ROS: negative for - blurry vision, double vision, eye pain or  loss of vision ENT ROS: negative for - epistaxis, nasal discharge, oral lesions, sore throat, tinnitus or vertigo Allergy and Immunology ROS: negative for - hives or itchy/watery eyes Hematological and Lymphatic ROS: negative for - bleeding problems, bruising or swollen lymph nodes Endocrine ROS: negative for - galactorrhea, hair pattern changes, polydipsia/polyuria or temperature intolerance Respiratory ROS: negative for - cough, hemoptysis, shortness of breath or wheezing Cardiovascular ROS: negative for - chest pain, dyspnea on exertion, edema or irregular heartbeat Gastrointestinal ROS: negative for - abdominal pain, diarrhea, hematemesis, nausea/vomiting or stool incontinence Genito-Urinary  ROS: negative for - dysuria, hematuria, incontinence or urinary frequency/urgency Musculoskeletal ROS: negative for - joint swelling or muscular weakness Neurological ROS: as noted in HPI Dermatological ROS: negative for rash and skin lesion changes  Physical Examination: Blood pressure 124/62, pulse 76, temperature 98.4 F (36.9 C), temperature source Oral, resp. rate 18, height 5\' 6"  (1.676 m), weight 82.9 kg, SpO2 95 %.  HEENT-  Normocephalic, no lesions, without obvious abnormality.  Normal external eye and conjunctiva.  Normal TM's bilaterally.  Normal auditory canals and external ears. Normal external nose, mucus membranes and septum.  Normal pharynx. Cardiovascular- S1, S2 normal, pulses palpable throughout   Lungs- chest clear, no wheezing, rales, normal symmetric air entry Abdomen- soft, non-tender; bowel sounds normal; no masses,  no organomegaly Extremities- no edema Lymph-no adenopathy palpable Musculoskeletal-no joint tenderness, deformity or swelling Skin-warm and dry, no hyperpigmentation, vitiligo, or suspicious lesions  Neurological Examination   Mental Status: Alert, oriented, thought content appropriate.  Speech fluent without evidence of aphasia.  Able to follow 3 step commands without difficulty. Cranial Nerves: II: Discs flat bilaterally; Visual fields grossly normal, pupils equal, round, reactive to light and accommodation III,IV, VI: ptosis not present, extra-ocular motions intact bilaterally V,VII: smile symmetric, facial light touch sensation normal bilaterally VIII: hearing normal bilaterally IX,X: gag reflex present XI: bilateral shoulder shrug XII: midline tongue extension Motor: Right : Upper extremity   5/5    Left:     Upper extremity   5/5  Lower extremity   5/5     Lower extremity   5/5 Tone and bulk:normal tone throughout; no atrophy noted Sensory: Pinprick and light touch intact throughout, bilaterally Deep Tendon Reflexes: 2+ with absent left  AJ Plantars: Right: downgoing   Left: downgoing Cerebellar: Normal finger-to-nose, normal rapid alternating movements and normal heel-to-shin testing bilaterally Gait: not tested due to safety concerns    Laboratory Studies:  Basic Metabolic Panel: Recent Labs  Lab 09/08/18 1615  NA 140  K 3.7  CL 98  CO2 26  GLUCOSE 208*  BUN 23  CREATININE 1.04*  CALCIUM 9.2    Liver Function Tests: Recent Labs  Lab 09/08/18 1615  AST 23  ALT 23  ALKPHOS 77  BILITOT 0.6  PROT 7.9  ALBUMIN 4.3   No results for input(s): LIPASE, AMYLASE in the last 168 hours. No results for input(s): AMMONIA in the last 168 hours.  CBC: Recent Labs  Lab 09/08/18 1615  WBC 9.1  NEUTROABS 4.1  HGB 14.1  HCT 42.8  MCV 92.2  PLT 301    Cardiac Enzymes: Recent Labs  Lab 09/08/18 1615  TROPONINI <0.03    BNP: Invalid input(s): POCBNP  CBG: Recent Labs  Lab 09/08/18 1609 09/08/18 2304 09/09/18 0839  GLUCAP 192* 202* 169*    Microbiology: Results for orders placed or performed during the hospital encounter of 08/23/18  Urine Culture     Status: Abnormal   Collection  Time: 08/23/18 12:50 PM  Result Value Ref Range Status   Specimen Description   Final    URINE, RANDOM Performed at [facility], [address], [place], [place] [address]    Special Requests   Final    NONE Performed at [facility], [address] [place], [place] [address]    Culture (A)  Final    10,000 COLONIES/mL GROUP B STREP(S.AGALACTIAE)ISOLATED TESTING AGAINST S. AGALACTIAE NOT ROUTINELY PERFORMED DUE TO PREDICTABILITY OF AMP/PEN/VAN SUSCEPTIBILITY. Performed at [facility], [address], [place], [place] [address]    Report Status 08/25/2018 FINAL  Final    Coagulation Studies: Recent Labs    09/08/18 1615  LABPROT 11.4  INR 0.84    Urinalysis: No results for input(s): COLORURINE, LABSPEC, PHURINE, GLUCOSEU, HGBUR, BILIRUBINUR, KETONESUR, PROTEINUR,  UROBILINOGEN, NITRITE, LEUKOCYTESUR in the last 168 hours.  Invalid input(s): APPERANCEUR  Lipid Panel:    Component Value Date/Time   CHOL 259 (H) 09/09/2018 0543   TRIG 555 (H) 09/09/2018 0543   HDL 36 (L) 09/09/2018 0543   CHOLHDL 7.2 09/09/2018 0543   VLDL UNABLE TO CALCULATE IF TRIGLYCERIDE OVER 400 mg/dL 91/47/8295 6213   LDLCALC UNABLE TO CALCULATE IF TRIGLYCERIDE OVER 400 mg/dL 08/65/7846 9629    BMWU1L: No results found for: HGBA1C  Urine Drug Screen:  No results found for: LABOPIA, COCAINSCRNUR, LABBENZ, AMPHETMU, THCU, LABBARB  Alcohol Level: No results for input(s): ETH in the last 168 hours.  Other results: EKG: sinus rhythm at 89 bpm.  Imaging: Mr Brain Wo Contrast  Result Date: 09/09/2018 CLINICAL DATA:  Right-sided numbness EXAM: MRI HEAD WITHOUT CONTRAST MRA HEAD WITHOUT CONTRAST TECHNIQUE: Multiplanar, multiecho pulse sequences of the brain and surrounding structures were obtained without intravenous contrast. Angiographic images of the head were obtained using MRA technique without contrast. COMPARISON:  Head CT 09/08/2018 FINDINGS: MRI HEAD FINDINGS BRAIN: There is no acute infarct, acute hemorrhage or mass effect. The midline structures are normal. There are no old infarcts. Minimal white matter hyperintensity, nonspecific and commonly seen in asymptomatic patients of this age. The CSF spaces are normal for age, with no hydrocephalus. Susceptibility-sensitive sequences show no chronic microhemorrhage or superficial siderosis. SKULL AND UPPER CERVICAL SPINE: The visualized skull base, calvarium, upper cervical spine and extracranial soft tissues are normal. SINUSES/ORBITS: No fluid levels or advanced mucosal thickening. No mastoid or middle ear effusion. The orbits are normal. MRA HEAD FINDINGS Intracranial internal carotid arteries: Normal. Anterior cerebral arteries: Normal. Middle cerebral arteries: Normal. Posterior communicating arteries: Present bilaterally.  Posterior cerebral arteries: There is loss of flow related enhancement over a short segment of the left posterior cerebral artery P2 segment. The right PCA is normal. Basilar artery: Normal. Vertebral arteries: Right dominant.  Normal. Superior cerebellar arteries: Normal. Inferior cerebellar arteries: Normal. IMPRESSION: 1. No acute intracranial abnormality. 2. Short segment loss of flow related enhancement within the left posterior cerebral artery P2 segment, likely indicating high-grade stenosis. Enhancement is preserved distally. Otherwise normal intracranial MRA. Electronically Signed   By: Deatra Robinson M.D.   On: 09/09/2018 02:34   US Carotid Bilateral (at [facility] And [facility] Only)  Result Date: 09/09/2018 CLINICAL DATA:  CVA.  History of diabetes. EXAM: BILATERAL CAROTID DUPLEX ULTRASOUND TECHNIQUE: Wallace Cullens scale imaging, color Doppler and duplex ultrasound were performed of bilateral carotid and vertebral arteries in the neck. COMPARISON:  Brain MRI-09/09/2018 FINDINGS: Criteria: Quantification of carotid stenosis is based on velocity parameters that correlate the residual internal carotid diameter with NASCET-based  stenosis levels, using the diameter of the distal internal carotid lumen as the denominator for stenosis measurement. The following velocity measurements were obtained: RIGHT ICA:  220/56 cm/sec CCA:  66/13 cm/sec SYSTOLIC ICA/CCA RATIO:  3.3 ECA:  146 cm/sec LEFT ICA:  199/62 cm/sec CCA:  75/21 cm/sec SYSTOLIC ICA/CCA RATIO:  2.7 ECA:  465 cm/sec RIGHT CAROTID ARTERY: There is a moderate to large amount of eccentric hypoechoic plaque within the right carotid bulb (images 16 and 17), extending to involve the origin and proximal aspects of the right internal carotid artery resulting in elevated peak systolic velocities throughout the interrogated course the right internal carotid artery. Greatest acquired peak systolic velocity with the proximal right ICA measures 220 centimeters/second (image 24).  RIGHT VERTEBRAL ARTERY:  Antegrade Flow LEFT CAROTID ARTERY: There is a moderate to large amount of eccentric mixed echogenic plaque within the left carotid bulb (images 47 and 48, extending to involve the origin and proximal aspect the left internal carotid artery (image 55), resulting in elevated peak systolic velocities throughout the interrogated course of the left internal carotid artery. Greatest acquired peak systolic velocity with the proximal left ICA measures 199 centimeters/second (image 57). LEFT VERTEBRAL ARTERY:  Antegrade Flow IMPRESSION: Large amount of bilateral atherosclerotic plaque results in elevated peak systolic velocities within the bilateral internal carotid arteries compatible with the higher end of the 50-69% luminal narrowing range, right greater than left. Further evaluation CTA could be performed as clinically indicated. Electronically Signed   By: Simonne Come M.D.   On: 09/09/2018 08:09   Mr Maxine Glenn Head/brain WG Cm  Result Date: 09/09/2018 CLINICAL DATA:  Right-sided numbness EXAM: MRI HEAD WITHOUT CONTRAST MRA HEAD WITHOUT CONTRAST TECHNIQUE: Multiplanar, multiecho pulse sequences of the brain and surrounding structures were obtained without intravenous contrast. Angiographic images of the head were obtained using MRA technique without contrast. COMPARISON:  Head CT 09/08/2018 FINDINGS: MRI HEAD FINDINGS BRAIN: There is no acute infarct, acute hemorrhage or mass effect. The midline structures are normal. There are no old infarcts. Minimal white matter hyperintensity, nonspecific and commonly seen in asymptomatic patients of this age. The CSF spaces are normal for age, with no hydrocephalus. Susceptibility-sensitive sequences show no chronic microhemorrhage or superficial siderosis. SKULL AND UPPER CERVICAL SPINE: The visualized skull base, calvarium, upper cervical spine and extracranial soft tissues are normal. SINUSES/ORBITS: No fluid levels or advanced mucosal thickening. No  mastoid or middle ear effusion. The orbits are normal. MRA HEAD FINDINGS Intracranial internal carotid arteries: Normal. Anterior cerebral arteries: Normal. Middle cerebral arteries: Normal. Posterior communicating arteries: Present bilaterally. Posterior cerebral arteries: There is loss of flow related enhancement over a short segment of the left posterior cerebral artery P2 segment. The right PCA is normal. Basilar artery: Normal. Vertebral arteries: Right dominant.  Normal. Superior cerebellar arteries: Normal. Inferior cerebellar arteries: Normal. IMPRESSION: 1. No acute intracranial abnormality. 2. Short segment loss of flow related enhancement within the left posterior cerebral artery P2 segment, likely indicating high-grade stenosis. Enhancement is preserved distally. Otherwise normal intracranial MRA. Electronically Signed   By: Deatra Robinson M.D.   On: 09/09/2018 02:34   Ct Head Code Stroke Wo Contrast  Result Date: 09/08/2018 CLINICAL DATA:  Code stroke.  RIGHT-sided numbness. EXAM: CT HEAD WITHOUT CONTRAST TECHNIQUE: Contiguous axial images were obtained from the base of the skull through the vertex without intravenous contrast. COMPARISON:  None. FINDINGS: BRAIN: No intraparenchymal hemorrhage, mass effect nor midline shift. The ventricles and sulci are normal for age. No acute large  vascular territory infarcts. No abnormal extra-axial fluid collections. Small midline posterior fossa arachnoid cyst. Basal cisterns are patent. VASCULAR: Mild calcific atherosclerosis of the carotid siphons. SKULL: No skull fracture. No significant scalp soft tissue swelling. RIGHT frontal tricholemmal cyst. SINUSES/ORBITS: Trace paranasal sinus mucosal thickening. Mastoid air cells are well aerated.The included ocular globes and orbital contents are non-suspicious. OTHER: None. ASPECTS [facility] Stroke Program Early CT Score) - Ganglionic level infarction (caudate, lentiform nuclei, internal capsule, insula, M1-M3  cortex): 7 - Supraganglionic infarction (M4-M6 cortex): 3 Total score (0-10 with 10 being normal): 10 IMPRESSION: 1. Normal CT HEAD without contrast for age. 2. ASPECTS is 10. 3. Critical Value/emergent results were called by telephone at the time of interpretation on 09/08/2018 at 4:23 pm to Dr. Dionne Bucy , who verbally acknowledged these results. Electronically Signed   By: Awilda Metro M.D.   On: 09/08/2018 16:25    Assessment: 62 y.o. female presenting with acute onset right sided numbness that is improving.  Current NIHSS of 0.  MRI of the brain reviewed and shows no acute changes.  MRA shows left P2 segmental stenosis.  Patient on no antiplatelet or anticoagulation prior to presentation.  Suspect TIA.  Carotid dopplers show likely hemodynamically significant stenosis bilaterally.  Echocardiogram is pending.  A1c pending, LDL unable to calculate due to elevated triglycerides.  Patient has been intolerant to statins in the past.  Stroke Risk Factors - diabetes mellitus, hyperlipidemia and hypertension  Plan: 1. HgbA1c pending.  Target A1c<7.0.   2. PT consult, OT consult, Speech consult 3. Echocardiogram pending 4. CTA of the head and neck to follow up carotid dopplers 5. Prophylactic therapy-Antiplatelet med: Aspirin - dose 81mg  daily 6. NPO until RN stroke swallow screen 7. Telemetry monitoring 8. Frequent neuro checks 9. Statin management of elevated LDL based on patient's tolerability to medications.  Target LDL<70.    Thana Farr, MD Neurology [phone] 09/09/2018, 10:52 AM

## 2018-09-09 NOTE — Discharge Planning (Addendum)
Patient IVX2 and tele removed.  RN assessment and VS revealed stability for DC to home with outpatient PT.  Discharge papers given, explained an educated.  Informed of suggested FU appt and appt made.  Printed script given.  Once ready, will be wheeled to front and family transporting home via car.

## 2018-09-09 NOTE — Progress Notes (Signed)
*  PRELIMINARY RESULTS* Echocardiogram 2D Echocardiogram has been performed.  Cristela Blue 09/09/2018, 12:21 PM

## 2018-09-09 NOTE — Discharge Instructions (Signed)
Diet control, excercise

## 2018-09-09 NOTE — Discharge Summary (Addendum)
[facility]   PATIENT NAME: Regina Moreno    MR#:  [id number]  DATE OF BIRTH:  Sep 18, 1956  DATE OF ADMISSION:  09/08/2018   ADMITTING PHYSICIAN: Auburn Bilberry, MD  DATE OF DISCHARGE: 09/09/2018 PRIMARY CARE PHYSICIAN: Lauro Regulus, MD   ADMISSION DIAGNOSIS:  Sensory deficit, right [R41.89] DISCHARGE DIAGNOSIS:  Active Problems:   Right sided numbness  SECONDARY DIAGNOSIS:   Past Medical History:  Diagnosis Date  . Diabetes mellitus without complication (HCC)   . Hypertension    HOSPITAL COURSE:  Patient is 62 year old presenting with right upper extremity right lower extremity numbness  1.  Right upper extremity right lower extremity numbness, no CVA. But has carotid artery stenosis per CTA. Echocardiogram is pending. She has been treated with aspirin, add plavix per Dr. Thad Ranger.  2.  Hypertension. Continue HTN meds. 3.  Diabetes type 2 Continue Home Regimen.  4.  Hyperlipidemia. start Lipitor 40 mg po HS, discontinue pravachol.   5.  GERD continue omeprazole  I discussed with Dr. Thad Ranger. DISCHARGE CONDITIONS:  Stable, discharge to home with outpatient PT today. CONSULTS OBTAINED:  Treatment Team:  Kym Groom, MD Thana Farr, MD Shaune Pollack, MD DRUG ALLERGIES:   Allergies  Allergen Reactions  . Ampicillin Nausea Only    Can take Augmentin  . Fenofibrate Nausea Only  . Simvastatin Nausea Only  . Sulfa Antibiotics Hives    Other reaction(s): Unknown   DISCHARGE MEDICATIONS:   Allergies as of 09/09/2018      Reactions   Ampicillin Nausea Only   Can take Augmentin   Fenofibrate Nausea Only   Simvastatin Nausea Only   Sulfa Antibiotics Hives   Other reaction(s): Unknown      Medication List    STOP taking these medications   pravastatin 10 MG tablet Commonly known as:  PRAVACHOL     TAKE these medications   albuterol 108 (90 Base) MCG/ACT inhaler Commonly known as:   PROVENTIL HFA;VENTOLIN HFA Inhale 2 puffs into the lungs every 6 (six) hours as needed for wheezing.   aspirin 81 MG tablet Take 4 tablets (325 mg total) by mouth daily. Start taking on:  09/10/2018   atorvastatin 40 MG tablet Commonly known as:  LIPITOR Take 1 tablet (40 mg total) by mouth daily at 6 PM.   clopidogrel 75 MG tablet Commonly known as:  PLAVIX Take 1 tablet (75 mg total) by mouth daily.   cyclobenzaprine 5 MG tablet Commonly known as:  FLEXERIL Take 5 mg by mouth 3 (three) times daily as needed for muscle spasms.   glipiZIDE-metformin 2.5-500 MG tablet Commonly known as:  METAGLIP Take 2 tablets by mouth 2 (two) times daily.   JARDIANCE 25 MG Tabs tablet Generic drug:  empagliflozin Take 25 mg by mouth daily.   olmesartan-hydrochlorothiazide 40-12.5 MG tablet Commonly known as:  BENICAR HCT Take 1 tablet by mouth daily.   omega-3 acid ethyl esters 1 g capsule Commonly known as:  LOVAZA Take 1 capsule (1 g total) by mouth 2 (two) times daily.   omeprazole 20 MG capsule Commonly known as:  PRILOSEC Take 1 capsule by mouth daily.   vitamin C 500 MG tablet Commonly known as:  ASCORBIC ACID Take 1,000 mg by mouth daily.        DISCHARGE INSTRUCTIONS:  See AVS. If you experience worsening of your admission symptoms, develop shortness of breath, life threatening emergency, suicidal or homicidal thoughts you must seek medical attention immediately  by calling 911 or calling your MD immediately  if symptoms less severe.  You Must read complete instructions/literature along with all the possible adverse reactions/side effects for all the Medicines you take and that have been prescribed to you. Take any new Medicines after you have completely understood and accpet all the possible adverse reactions/side effects.   Please note  You were cared for by a hospitalist during your hospital stay. If you have any questions about your discharge medications or the care  you received while you were in the hospital after you are discharged, you can call the unit and asked to speak with the hospitalist on call if the hospitalist that took care of you is not available. Once you are discharged, your primary care physician will handle any further medical issues. Please note that NO REFILLS for any discharge medications will be authorized once you are discharged, as it is imperative that you return to your primary care physician (or establish a relationship with a primary care physician if you do not have one) for your aftercare needs so that they can reassess your need for medications and monitor your lab values.    On the day of Discharge:  VITAL SIGNS:  Blood pressure 124/62, pulse 76, temperature 98.4 F (36.9 C), temperature source Oral, resp. rate 18, height 5\' 6"  (1.676 m), weight 82.9 kg, SpO2 95 %. PHYSICAL EXAMINATION:  GENERAL:  62 y.o.-year-old patient lying in the bed with no acute distress.  EYES: Pupils equal, round, reactive to light and accommodation. No scleral icterus. Extraocular muscles intact.  HEENT: Head atraumatic, normocephalic. Oropharynx and nasopharynx clear.  NECK:  Supple, no jugular venous distention. No thyroid enlargement, no tenderness.  LUNGS: Normal breath sounds bilaterally, no wheezing, rales,rhonchi or crepitation. No use of accessory muscles of respiration.  CARDIOVASCULAR: S1, S2 normal. No murmurs, rubs, or gallops.  ABDOMEN: Soft, non-tender, non-distended. Bowel sounds present. No organomegaly or mass.  EXTREMITIES: No pedal edema, cyanosis, or clubbing.  NEUROLOGIC: Cranial nerves II through XII are intact. Muscle strength 5/5 in all extremities. Sensation intact. Gait not checked.  PSYCHIATRIC: The patient is alert and oriented x 3.  SKIN: No obvious rash, lesion, or ulcer.  DATA REVIEW:   CBC Recent Labs  Lab 09/08/18 1615  WBC 9.1  HGB 14.1  HCT 42.8  PLT 301    Chemistries  Recent Labs  Lab 09/08/18 1615    NA 140  K 3.7  CL 98  CO2 26  GLUCOSE 208*  BUN 23  CREATININE 1.04*  CALCIUM 9.2  AST 23  ALT 23  ALKPHOS 77  BILITOT 0.6     Microbiology Results  Results for orders placed or performed during the hospital encounter of 08/23/18  Urine Culture     Status: Abnormal   Collection Time: 08/23/18 12:50 PM  Result Value Ref Range Status   Specimen Description   Final    URINE, RANDOM Performed at [facility], [address], [place], [place] [address]    Special Requests   Final    NONE Performed at [facility], [address], [place], [place] [address]    Culture (A)  Final    10,000 COLONIES/mL GROUP B STREP(S.AGALACTIAE)ISOLATED TESTING AGAINST S. AGALACTIAE NOT ROUTINELY PERFORMED DUE TO PREDICTABILITY OF AMP/PEN/VAN SUSCEPTIBILITY. Performed at [facility], [address] [place], [place] [address]    Report Status 08/25/2018 FINAL  Final    RADIOLOGY:  Ct Angio Head W Or Wo  Contrast  Result Date: 09/09/2018 CLINICAL DATA:  Right-sided numbness beginning yesterday which has improved. MRI did not show any infarction, but showed a left P2 stenosis. EXAM: CT ANGIOGRAPHY HEAD AND NECK TECHNIQUE: Multidetector CT imaging of the head and neck was performed using the standard protocol during bolus administration of intravenous contrast. Multiplanar CT image reconstructions and MIPs were obtained to evaluate the vascular anatomy. Carotid stenosis measurements (when applicable) are obtained utilizing NASCET criteria, using the distal internal carotid diameter as the denominator. CONTRAST:  75mL ISOVUE-370 IOPAMIDOL (ISOVUE-370) INJECTION 76% COMPARISON:  CT 09/08/2018.  MRI earlier same day. FINDINGS: CTA NECK FINDINGS Aortic arch: Mild atherosclerosis of the aortic arch. No aneurysm or dissection. Branching pattern of the brachiocephalic vessels is normal. Right carotid system: Common carotid artery widely patent to the bifurcation region. There is  atherosclerotic plaque at the carotid bifurcation and ICA bulb. Minimal diameter of the proximal ICA is 2.2 mm. Compared to a more distal cervical ICA diameter of 4.5 mm, this indicates a 50% stenosis. Left carotid system: Common carotid artery widely patent to the bifurcation region. There is atherosclerotic plaque at the carotid bifurcation with stenosis of the proximal internal and external carotid arteries. External carotid artery lumen is 1 mm. Minimal diameter of the proximal ICA is 2.3 mm. Compared to a more distal cervical ICA diameter of 4.7 mm, this indicates a 50% stenosis. Vertebral arteries: Both vertebral artery origins are widely patent. The right is dominant. Both vertebral arteries are widely patent through the cervical region to the foramen magnum. Skeleton: Normal appearance of the cervical spine. Other neck: No soft tissue mass or lymphadenopathy. Upper chest: Normal Review of the MIP images confirms the above findings CTA HEAD FINDINGS Anterior circulation: Both internal carotid arteries are patent through the skull base and siphon regions. There is atherosclerotic calcification in both carotid siphon regions but no stenosis greater than 30%. The anterior and middle cerebral vessels are patent without proximal stenosis, aneurysm or vascular malformation. No missing distal branch vessels are identified. Posterior circulation: Both vertebral arteries are patent through the foramen magnum to the basilar. The right is dominant. There is no basilar stenosis. Posterior circulation branch vessels are patent. This study confirms the presence of a moderate left P2 stenosis on the left with good flow distal. Venous sinuses: Patent and normal. Anatomic variants: None significant. Delayed phase: No abnormal enhancement. Review of the MIP images confirms the above findings IMPRESSION: Atherosclerotic disease at both carotid bifurcations. Maximal stenosis of 50% in the proximal ICA bulb on each side. Severe  stenosis of the proximal left external carotid artery. No intracranial anterior circulation abnormality seen. Ordinary atherosclerotic calcification in the carotid siphon regions but without stenosis greater than 30%. Today's study confirms the presence of a focal stenosis of the left P2 segment, but with good flow distal to that. Nonetheless, the patient would be at risk of left PCA territory infarction based on this stenosis. Electronically Signed   By: Paulina Fusi M.D.   On: 09/09/2018 11:51   Ct Angio Neck W Or Wo Contrast  Result Date: 09/09/2018 CLINICAL DATA:  Right-sided numbness beginning yesterday which has improved. MRI did not show any infarction, but showed a left P2 stenosis. EXAM: CT ANGIOGRAPHY HEAD AND NECK TECHNIQUE: Multidetector CT imaging of the head and neck was performed using the standard protocol during bolus administration of intravenous contrast. Multiplanar CT image reconstructions and MIPs were obtained to evaluate the vascular anatomy. Carotid stenosis measurements (when applicable) are obtained utilizing  NASCET criteria, using the distal internal carotid diameter as the denominator. CONTRAST:  75mL ISOVUE-370 IOPAMIDOL (ISOVUE-370) INJECTION 76% COMPARISON:  CT 09/08/2018.  MRI earlier same day. FINDINGS: CTA NECK FINDINGS Aortic arch: Mild atherosclerosis of the aortic arch. No aneurysm or dissection. Branching pattern of the brachiocephalic vessels is normal. Right carotid system: Common carotid artery widely patent to the bifurcation region. There is atherosclerotic plaque at the carotid bifurcation and ICA bulb. Minimal diameter of the proximal ICA is 2.2 mm. Compared to a more distal cervical ICA diameter of 4.5 mm, this indicates a 50% stenosis. Left carotid system: Common carotid artery widely patent to the bifurcation region. There is atherosclerotic plaque at the carotid bifurcation with stenosis of the proximal internal and external carotid arteries. External carotid  artery lumen is 1 mm. Minimal diameter of the proximal ICA is 2.3 mm. Compared to a more distal cervical ICA diameter of 4.7 mm, this indicates a 50% stenosis. Vertebral arteries: Both vertebral artery origins are widely patent. The right is dominant. Both vertebral arteries are widely patent through the cervical region to the foramen magnum. Skeleton: Normal appearance of the cervical spine. Other neck: No soft tissue mass or lymphadenopathy. Upper chest: Normal Review of the MIP images confirms the above findings CTA HEAD FINDINGS Anterior circulation: Both internal carotid arteries are patent through the skull base and siphon regions. There is atherosclerotic calcification in both carotid siphon regions but no stenosis greater than 30%. The anterior and middle cerebral vessels are patent without proximal stenosis, aneurysm or vascular malformation. No missing distal branch vessels are identified. Posterior circulation: Both vertebral arteries are patent through the foramen magnum to the basilar. The right is dominant. There is no basilar stenosis. Posterior circulation branch vessels are patent. This study confirms the presence of a moderate left P2 stenosis on the left with good flow distal. Venous sinuses: Patent and normal. Anatomic variants: None significant. Delayed phase: No abnormal enhancement. Review of the MIP images confirms the above findings IMPRESSION: Atherosclerotic disease at both carotid bifurcations. Maximal stenosis of 50% in the proximal ICA bulb on each side. Severe stenosis of the proximal left external carotid artery. No intracranial anterior circulation abnormality seen. Ordinary atherosclerotic calcification in the carotid siphon regions but without stenosis greater than 30%. Today's study confirms the presence of a focal stenosis of the left P2 segment, but with good flow distal to that. Nonetheless, the patient would be at risk of left PCA territory infarction based on this stenosis.  Electronically Signed   By: Paulina Fusi M.D.   On: 09/09/2018 11:51   Mr Brain Wo Contrast  Result Date: 09/09/2018 CLINICAL DATA:  Right-sided numbness EXAM: MRI HEAD WITHOUT CONTRAST MRA HEAD WITHOUT CONTRAST TECHNIQUE: Multiplanar, multiecho pulse sequences of the brain and surrounding structures were obtained without intravenous contrast. Angiographic images of the head were obtained using MRA technique without contrast. COMPARISON:  Head CT 09/08/2018 FINDINGS: MRI HEAD FINDINGS BRAIN: There is no acute infarct, acute hemorrhage or mass effect. The midline structures are normal. There are no old infarcts. Minimal white matter hyperintensity, nonspecific and commonly seen in asymptomatic patients of this age. The CSF spaces are normal for age, with no hydrocephalus. Susceptibility-sensitive sequences show no chronic microhemorrhage or superficial siderosis. SKULL AND UPPER CERVICAL SPINE: The visualized skull base, calvarium, upper cervical spine and extracranial soft tissues are normal. SINUSES/ORBITS: No fluid levels or advanced mucosal thickening. No mastoid or middle ear effusion. The orbits are normal. MRA HEAD FINDINGS Intracranial internal carotid  arteries: Normal. Anterior cerebral arteries: Normal. Middle cerebral arteries: Normal. Posterior communicating arteries: Present bilaterally. Posterior cerebral arteries: There is loss of flow related enhancement over a short segment of the left posterior cerebral artery P2 segment. The right PCA is normal. Basilar artery: Normal. Vertebral arteries: Right dominant.  Normal. Superior cerebellar arteries: Normal. Inferior cerebellar arteries: Normal. IMPRESSION: 1. No acute intracranial abnormality. 2. Short segment loss of flow related enhancement within the left posterior cerebral artery P2 segment, likely indicating high-grade stenosis. Enhancement is preserved distally. Otherwise normal intracranial MRA. Electronically Signed   By: Deatra Robinson M.D.    On: 09/09/2018 02:34   US Carotid Bilateral (at [facility] And [facility] Only)  Result Date: 09/09/2018 CLINICAL DATA:  CVA.  History of diabetes. EXAM: BILATERAL CAROTID DUPLEX ULTRASOUND TECHNIQUE: Wallace Cullens scale imaging, color Doppler and duplex ultrasound were performed of bilateral carotid and vertebral arteries in the neck. COMPARISON:  Brain MRI-09/09/2018 FINDINGS: Criteria: Quantification of carotid stenosis is based on velocity parameters that correlate the residual internal carotid diameter with NASCET-based stenosis levels, using the diameter of the distal internal carotid lumen as the denominator for stenosis measurement. The following velocity measurements were obtained: RIGHT ICA:  220/56 cm/sec CCA:  66/13 cm/sec SYSTOLIC ICA/CCA RATIO:  3.3 ECA:  146 cm/sec LEFT ICA:  199/62 cm/sec CCA:  75/21 cm/sec SYSTOLIC ICA/CCA RATIO:  2.7 ECA:  465 cm/sec RIGHT CAROTID ARTERY: There is a moderate to large amount of eccentric hypoechoic plaque within the right carotid bulb (images 16 and 17), extending to involve the origin and proximal aspects of the right internal carotid artery resulting in elevated peak systolic velocities throughout the interrogated course the right internal carotid artery. Greatest acquired peak systolic velocity with the proximal right ICA measures 220 centimeters/second (image 24). RIGHT VERTEBRAL ARTERY:  Antegrade Flow LEFT CAROTID ARTERY: There is a moderate to large amount of eccentric mixed echogenic plaque within the left carotid bulb (images 47 and 48, extending to involve the origin and proximal aspect the left internal carotid artery (image 55), resulting in elevated peak systolic velocities throughout the interrogated course of the left internal carotid artery. Greatest acquired peak systolic velocity with the proximal left ICA measures 199 centimeters/second (image 57). LEFT VERTEBRAL ARTERY:  Antegrade Flow IMPRESSION: Large amount of bilateral atherosclerotic plaque results in elevated  peak systolic velocities within the bilateral internal carotid arteries compatible with the higher end of the 50-69% luminal narrowing range, right greater than left. Further evaluation CTA could be performed as clinically indicated. Electronically Signed   By: Simonne Come M.D.   On: 09/09/2018 08:09   Mr Maxine Glenn Head/brain ZO Cm  Result Date: 09/09/2018 CLINICAL DATA:  Right-sided numbness EXAM: MRI HEAD WITHOUT CONTRAST MRA HEAD WITHOUT CONTRAST TECHNIQUE: Multiplanar, multiecho pulse sequences of the brain and surrounding structures were obtained without intravenous contrast. Angiographic images of the head were obtained using MRA technique without contrast. COMPARISON:  Head CT 09/08/2018 FINDINGS: MRI HEAD FINDINGS BRAIN: There is no acute infarct, acute hemorrhage or mass effect. The midline structures are normal. There are no old infarcts. Minimal white matter hyperintensity, nonspecific and commonly seen in asymptomatic patients of this age. The CSF spaces are normal for age, with no hydrocephalus. Susceptibility-sensitive sequences show no chronic microhemorrhage or superficial siderosis. SKULL AND UPPER CERVICAL SPINE: The visualized skull base, calvarium, upper cervical spine and extracranial soft tissues are normal. SINUSES/ORBITS: No fluid levels or advanced mucosal thickening. No mastoid or middle ear effusion. The orbits are normal. MRA HEAD  FINDINGS Intracranial internal carotid arteries: Normal. Anterior cerebral arteries: Normal. Middle cerebral arteries: Normal. Posterior communicating arteries: Present bilaterally. Posterior cerebral arteries: There is loss of flow related enhancement over a short segment of the left posterior cerebral artery P2 segment. The right PCA is normal. Basilar artery: Normal. Vertebral arteries: Right dominant.  Normal. Superior cerebellar arteries: Normal. Inferior cerebellar arteries: Normal. IMPRESSION: 1. No acute intracranial abnormality. 2. Short segment loss of  flow related enhancement within the left posterior cerebral artery P2 segment, likely indicating high-grade stenosis. Enhancement is preserved distally. Otherwise normal intracranial MRA. Electronically Signed   By: Deatra Robinson M.D.   On: 09/09/2018 02:34   Ct Head Code Stroke Wo Contrast  Result Date: 09/08/2018 CLINICAL DATA:  Code stroke.  RIGHT-sided numbness. EXAM: CT HEAD WITHOUT CONTRAST TECHNIQUE: Contiguous axial images were obtained from the base of the skull through the vertex without intravenous contrast. COMPARISON:  None. FINDINGS: BRAIN: No intraparenchymal hemorrhage, mass effect nor midline shift. The ventricles and sulci are normal for age. No acute large vascular territory infarcts. No abnormal extra-axial fluid collections. Small midline posterior fossa arachnoid cyst. Basal cisterns are patent. VASCULAR: Mild calcific atherosclerosis of the carotid siphons. SKULL: No skull fracture. No significant scalp soft tissue swelling. RIGHT frontal tricholemmal cyst. SINUSES/ORBITS: Trace paranasal sinus mucosal thickening. Mastoid air cells are well aerated.The included ocular globes and orbital contents are non-suspicious. OTHER: None. ASPECTS [facility] Stroke Program Early CT Score) - Ganglionic level infarction (caudate, lentiform nuclei, internal capsule, insula, M1-M3 cortex): 7 - Supraganglionic infarction (M4-M6 cortex): 3 Total score (0-10 with 10 being normal): 10 IMPRESSION: 1. Normal CT HEAD without contrast for age. 2. ASPECTS is 10. 3. Critical Value/emergent results were called by telephone at the time of interpretation on 09/08/2018 at 4:23 pm to Dr. Dionne Bucy , who verbally acknowledged these results. Electronically Signed   By: Awilda Metro M.D.   On: 09/08/2018 16:25     Management plans discussed with the patient, her son and brother and they are in agreement.  CODE STATUS: Full Code   TOTAL TIME TAKING CARE OF THIS PATIENT: 35 minutes.    Shaune Pollack M.D on  09/09/2018 at 1:18 PM  Between 7am to 6pm - Pager - [phone]  After 6pm go to [email or web address] - Scientist, research (life sciences) [facility]  Office  [phone]  CC: Primary care physician; Lauro Regulus, MD   Note: This dictation was prepared with Dragon dictation along with smaller phrase technology. Any transcriptional errors that result from this process are unintentional.

## 2018-09-09 NOTE — Progress Notes (Signed)
OT Cancellation Note  Patient Details Name: Regina Moreno MRN: [id number] DOB: 1956/03/06   Cancelled Treatment:    Reason Eval/Treat Not Completed: OT screened, no needs identified, will sign off Order received and chart reviewed.  Pt had just come back from Echocardiogram and CT.  She continues to have numbness in RUE and foot but does not present with any deficits in ADLs since she has full motor movement and control.  Reviewed recommendations to prevent injury and is scheduled to see outpatient PT.  No OT needs identified that cannot be addressed by PT at this time.  Please re-consult if anything changes medically.  Thank you for the referral.  Susanne Borders, OTR/L ascom [phone] 09/09/18, 12:28 PM

## 2018-10-12 ENCOUNTER — Ambulatory Visit: Payer: BLUE CROSS/BLUE SHIELD | Admitting: Physical Therapy

## 2018-10-17 ENCOUNTER — Ambulatory Visit: Payer: BLUE CROSS/BLUE SHIELD | Admitting: Physical Therapy

## 2018-10-19 ENCOUNTER — Ambulatory Visit: Payer: BLUE CROSS/BLUE SHIELD | Admitting: Physical Therapy

## 2018-10-25 ENCOUNTER — Ambulatory Visit: Payer: BLUE CROSS/BLUE SHIELD | Admitting: Physical Therapy

## 2018-10-27 ENCOUNTER — Other Ambulatory Visit (HOSPITAL_COMMUNITY): Payer: Self-pay | Admitting: Internal Medicine

## 2018-10-27 ENCOUNTER — Other Ambulatory Visit: Payer: Self-pay | Admitting: Internal Medicine

## 2018-10-27 DIAGNOSIS — M5412 Radiculopathy, cervical region: Secondary | ICD-10-CM

## 2018-10-27 DIAGNOSIS — R200 Anesthesia of skin: Secondary | ICD-10-CM

## 2018-10-27 DIAGNOSIS — I779 Disorder of arteries and arterioles, unspecified: Secondary | ICD-10-CM | POA: Insufficient documentation

## 2018-10-31 ENCOUNTER — Ambulatory Visit: Payer: BLUE CROSS/BLUE SHIELD | Admitting: Physical Therapy

## 2018-11-03 ENCOUNTER — Ambulatory Visit: Payer: BLUE CROSS/BLUE SHIELD | Admitting: Physical Therapy

## 2018-11-07 ENCOUNTER — Ambulatory Visit: Payer: BLUE CROSS/BLUE SHIELD | Admitting: Physical Therapy

## 2018-11-09 ENCOUNTER — Ambulatory Visit: Payer: BLUE CROSS/BLUE SHIELD | Admitting: Physical Therapy

## 2018-11-12 ENCOUNTER — Ambulatory Visit
Admission: RE | Admit: 2018-11-12 | Discharge: 2018-11-12 | Disposition: A | Discharge status: Home / Self Care | Payer: BLUE CROSS/BLUE SHIELD | Source: Ambulatory Visit | Attending: Internal Medicine | Admitting: Internal Medicine

## 2018-11-12 DIAGNOSIS — R200 Anesthesia of skin: Secondary | ICD-10-CM | POA: Insufficient documentation

## 2018-11-12 DIAGNOSIS — M5412 Radiculopathy, cervical region: Secondary | ICD-10-CM | POA: Diagnosis present

## 2018-11-14 ENCOUNTER — Ambulatory Visit: Payer: BLUE CROSS/BLUE SHIELD | Admitting: Physical Therapy

## 2018-11-16 ENCOUNTER — Ambulatory Visit: Payer: BLUE CROSS/BLUE SHIELD | Admitting: Physical Therapy

## 2018-11-21 ENCOUNTER — Ambulatory Visit: Payer: BLUE CROSS/BLUE SHIELD | Admitting: Physical Therapy

## 2018-11-23 ENCOUNTER — Ambulatory Visit: Payer: BLUE CROSS/BLUE SHIELD | Admitting: Physical Therapy

## 2018-11-28 ENCOUNTER — Ambulatory Visit: Payer: BLUE CROSS/BLUE SHIELD | Admitting: Physical Therapy

## 2018-11-30 ENCOUNTER — Ambulatory Visit: Payer: BLUE CROSS/BLUE SHIELD | Admitting: Physical Therapy

## 2019-03-19 ENCOUNTER — Ambulatory Visit
Admission: EM | Admit: 2019-03-19 | Discharge: 2019-03-19 | Disposition: A | Discharge status: Home / Self Care | Payer: BLUE CROSS/BLUE SHIELD | Attending: Family Medicine | Admitting: Family Medicine

## 2019-03-19 ENCOUNTER — Other Ambulatory Visit: Payer: Self-pay

## 2019-03-19 ENCOUNTER — Encounter: Payer: Self-pay | Admitting: Emergency Medicine

## 2019-03-19 DIAGNOSIS — B029 Zoster without complications: Secondary | ICD-10-CM | POA: Diagnosis not present

## 2019-03-19 MED ORDER — VALACYCLOVIR HCL 1 G PO TABS: 1000.0000 mg | ORAL_TABLET | Freq: Three times a day (TID) | ORAL | 0 refills | Status: AC

## 2019-03-19 NOTE — ED Triage Notes (Signed)
Patient c/o itchy rash on the left side of her back that started on Wednesday. It has now spread to under her left arm. The rash is painful now and burns.

## 2019-03-19 NOTE — Discharge Instructions (Signed)
It was very nice meeting you today in clinic. Thank you for entrusting me with your care.   As discussed, you have a shingles rash. Until your rash crusts over...YOU ARE contagious.   Please utilize the medications that we discussed. Your prescriptions have been called in to your pharmacy.  May use Tylenol and/or Ibuprofen as needed for pain/fever.   Make arrangements to follow up with your regular doctor in 1 week for re-evaluation. If your symptoms/condition worsens, please seek follow up care either here or in the ER. Please remember, our [facility] providers are "right here with you" when you need [place].   Again, it was my pleasure to take care of you today. Thank you for choosing our clinic. I hope that you start to feel better quickly.   Quentin Mulling, MSN, APRN, FNP-C, CEN Advanced Practice Provider [facility]

## 2019-03-19 NOTE — ED Provider Notes (Signed)
[address], [address] [place], [place] [address] [phone]   Name: Regina Moreno DOB: [birth year] MRN: [id number] CSN: [id number] PCP: Lauro Regulus, MD  Arrival date and time:  03/19/19 1223  Chief Complaint:  Rash  NOTE: Prior to seeing the patient today, I have reviewed the triage nursing documentation and vital signs. Clinical staff has updated patient's PMH/PSHx, current medication list, and drug allergies/intolerances to ensure comprehensive history available to assist in medical decision making.   History:   HPI: Regina Moreno is a 63 y.o. female who presents today with complaints of a rash to her back that started on Wednesday (03/15/2019). Rash started to her LEFT mid-lower back initially and was pruritic. Over the course of the last few days, the rash has "blown up" and wrapped circumferentially around her LEFT side into her axilla and breast area. She has a few small lesions to her posterior neck. Patient describes the rash as being "itchy and burning" at this point. Additionally, patient has some peri-orbital tenderness to her LEFT eye. No visual changes. No rash noted surrounding her eye. She denies fevers and recent illnesses.   Past Medical History:  Diagnosis Date  . Diabetes mellitus without complication (HCC)   . Hypertension     Past Surgical History:  Procedure Laterality Date  . ABDOMINAL HYSTERECTOMY      Family History  Problem Relation Age of Onset  . CAD Mother   . Diabetes Father   . Breast cancer Neg Hx     Social History   Socioeconomic History  . Marital status: Married    Spouse name: Not on file  . Number of children: Not on file  . Years of education: Not on file  . Highest education level: Not on file  Occupational History  . Not on file  Social Needs  . Financial resource strain: Not on file  . Food insecurity:    Worry: Not on file    Inability: Not on file  . Transportation needs:    Medical: Not on file    Non-medical: Not on file  Tobacco Use  . Smoking status: Former Smoker    Types: Cigarettes  . Smokeless tobacco: Never Used  Substance and Sexual Activity  . Alcohol use: No  . Drug use: No  . Sexual activity: Yes  Lifestyle  . Physical activity:    Days per week: Not on file    Minutes per session: Not on file  . Stress: Not on file  Relationships  . Social connections:    Talks on phone: Not on file    Gets together: Not on file    Attends religious service: Not on file    Active member of club or organization: Not on file    Attends meetings of clubs or organizations: Not on file    Relationship status: Not on file  . Intimate partner violence:    Fear of current or ex partner: Not on file    Emotionally abused: Not on file    Physically abused: Not on file    Forced sexual activity: Not on file  Other Topics Concern  . Not on file  Social History Narrative  . Not on file    Patient Active Problem List   Diagnosis Date Noted  . Right sided numbness 09/08/2018    Home Medications:    Current Meds  Medication Sig  . aspirin 81 MG tablet Take 4 tablets (325 mg total) by mouth daily.  Marland Kitchen  atorvastatin (LIPITOR) 40 MG tablet Take 1 tablet (40 mg total) by mouth daily at 6 PM.  . clopidogrel (PLAVIX) 75 MG tablet Take 1 tablet (75 mg total) by mouth daily.  Marland Kitchen glipiZIDE-metformin (METAGLIP) 2.5-500 MG per tablet Take 2 tablets by mouth 2 (two) times daily.  Marland Kitchen JARDIANCE 25 MG TABS tablet Take 25 mg by mouth daily.  Marland Kitchen olmesartan-hydrochlorothiazide (BENICAR HCT) 40-12.5 MG tablet Take 1 tablet by mouth daily.  Marland Kitchen omega-3 acid ethyl esters (LOVAZA) 1 g capsule Take 1 capsule (1 g total) by mouth 2 (two) times daily.  Marland Kitchen omeprazole (PRILOSEC) 20 MG capsule Take 1 capsule by mouth daily.  . vitamin C (ASCORBIC ACID) 500 MG tablet Take 1,000 mg by mouth daily.    Allergies:   Ampicillin; Fenofibrate; Simvastatin; and Sulfa antibiotics  Review of Systems (ROS): Review of  Systems  Constitutional: Negative for chills and fever.  Eyes: Positive for pain (LEFT peri-orbital tenderness). Negative for photophobia, discharge, redness, itching and visual disturbance.  Respiratory: Negative for cough and shortness of breath.   Cardiovascular: Negative for chest pain and palpitations.  Skin: Positive for rash.     Physical Exam:  Triage Vital Signs ED Triage Vitals  Enc Vitals Group     BP 03/19/19 1241 (!) 163/75     Pulse Rate 03/19/19 1241 81     Resp 03/19/19 1241 18     Temp 03/19/19 1241 98.1 F (36.7 C)     Temp Source 03/19/19 1241 Oral     SpO2 03/19/19 1241 99 %     Weight 03/19/19 1239 181 lb (82.1 kg)     Height 03/19/19 1239 5\' 6"  (1.676 m)     Head Circumference --      Peak Flow --      Pain Score 03/19/19 1239 4     Pain Loc --      Pain Edu? --      Excl. in GC? --     Physical Exam  Constitutional: She is oriented to person, place, and time and well-developed, well-nourished, and in no distress.  HENT:  Head: Normocephalic and atraumatic.  Mouth/Throat: Oropharynx is clear and moist and mucous membranes are normal.  Eyes: Pupils are equal, round, and reactive to light. Conjunctivae and EOM are normal.  No rash noted near/around eyes  Neck: Normal range of motion. Neck supple. No tracheal deviation present.  Cardiovascular: Normal rate, regular rhythm, normal heart sounds and intact distal pulses. Exam reveals no gallop and no friction rub.  No murmur heard. Pulmonary/Chest: Effort normal and breath sounds normal. No respiratory distress. She has no wheezes. She has no rales.  Lymphadenopathy:    She has no cervical adenopathy.  Neurological: She is alert and oriented to person, place, and time.  Skin: Skin is warm and dry. Rash noted. Rash is vesicular.  Vesicular rash noted from LEFT mid-back extending along same dermatomal pattern into axilla and anterior chest wall. (+) erythema with no drainage. (+) tender to palpation.    Psychiatric: Mood, affect and judgment normal.  Nursing note and vitals reviewed.    Urgent Care Treatments / Results:   LABS: PLEASE NOTE: all labs that were ordered this encounter are listed, however only abnormal results are displayed. Labs Reviewed - No data to display  EKG: -None  RADIOLOGY: No results found.  PRODEDURES: Procedures  MEDICATIONS RECEIVED THIS VISIT: Medications - No data to display  PERTINENT CLINICAL COURSE NOTES/UPDATES: No data to display  Initial Impression / Assessment and Plan / Urgent Care Course:    Regina Moreno is a 63 y.o. female who presents to [facility] today with complaints of Rash  Pertinent labs & imaging results that were available during my care of the patient were personally reviewed by me and considered in my medical decision making (see lab/imaging section of note for values and interpretations).  Exam reveals vesicular rash to LEFT back extending into axilla and anterior chest wall. Few lesions on posterior neck. Appearance and distribution consistent with HZV rash. Will treat with valacyclovir 1000 mg TID x 7 days. Discussed LEFT peri-orbital tenderness. While there are no lesions present at this time, patient encouraged to monitor closely and follow up sooner if eye becomes involved. Patient encouraged to wash hands frequently and avoid touching face. Discussed increased risk of contact transmission until rash dries and crusts over. May use Tylenol and/or Ibuprofen as needed for pain/fever.   Discussed follow up with primary care physician in 1 week for re-evaluation. I have reviewed the follow up and strict return precautions for any new or worsening symptoms. Patient is aware of symptoms that would be deemed urgent/emergent, and would thus require further evaluation either here or in the emergency department. At the time of discharge, she verbalized understanding and consent with the discharge plan as it was reviewed with  her. All questions were fielded by provider and/or clinic staff prior to patient discharge.    Final Clinical Impressions(s) / Urgent Care Diagnoses:   Final diagnoses:  Herpes zoster without complication    New Prescriptions:   Meds ordered this encounter  Medications  . valACYclovir (VALTREX) 1000 MG tablet    Sig: Take 1 tablet (1,000 mg total) by mouth 3 (three) times daily for 7 days.    Dispense:  21 tablet    Refill:  0    Controlled Substance Prescriptions:  [place] Controlled Substance Registry consulted? Not Applicable  NOTE: This note was prepared using Dragon dictation software along with smaller phrase technology. Despite my best ability to proofread, there is the potential that transcriptional errors may still occur from this process, and are completely unintentional.     Verlee Monte, NP 03/19/19 1317

## 2019-05-22 IMAGING — US US CAROTID DUPLEX BILAT
1 series · 13 of 24 positions shown · non-contrast
Comparison: Brain MRI-09/09/2018

CLINICAL DATA: CVA.  History of diabetes.

EXAM:
BILATERAL CAROTID DUPLEX ULTRASOUND
TECHNIQUE: Gray scale imaging, color Doppler and duplex ultrasound were
performed of bilateral carotid and vertebral arteries in the neck.

[Series 1: us carotid duplex bilat · 13 of 66 slices shown]
[im 1/66]
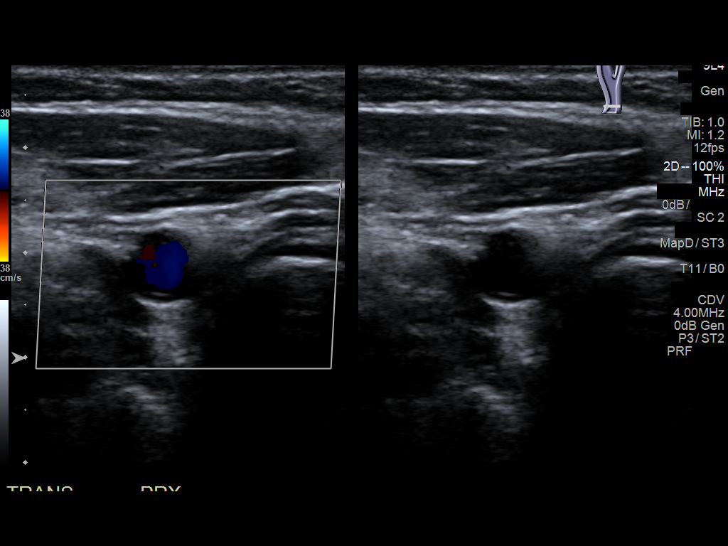
[im 6/66]
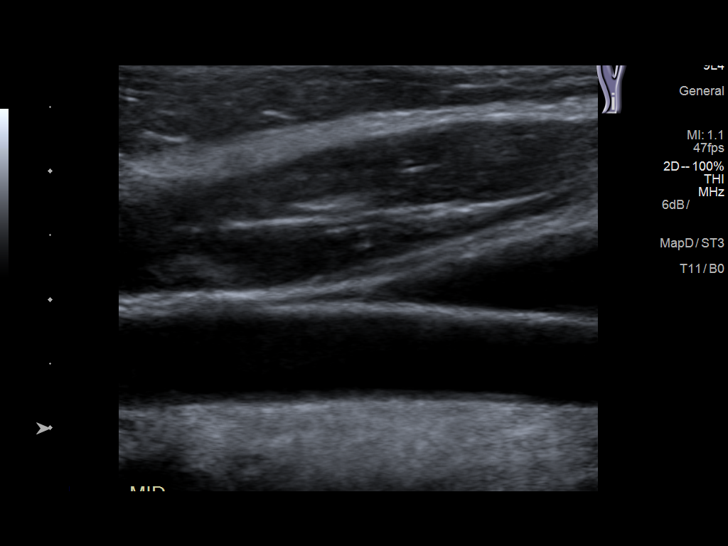
[im 12/66]
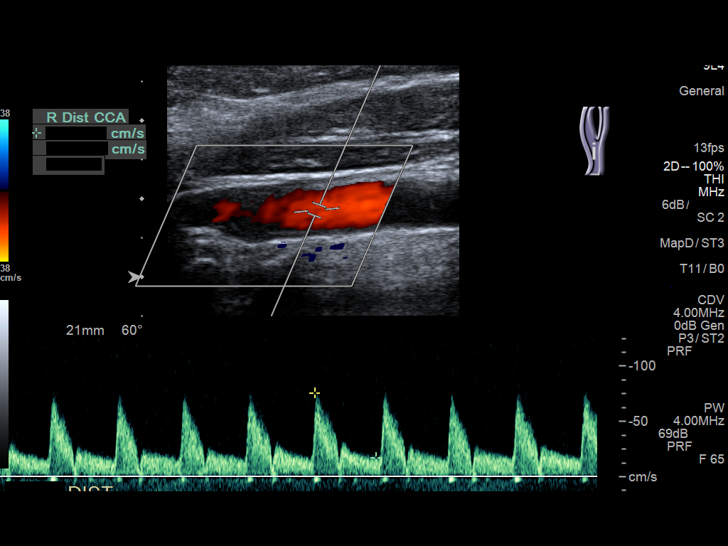
[im 17/66]
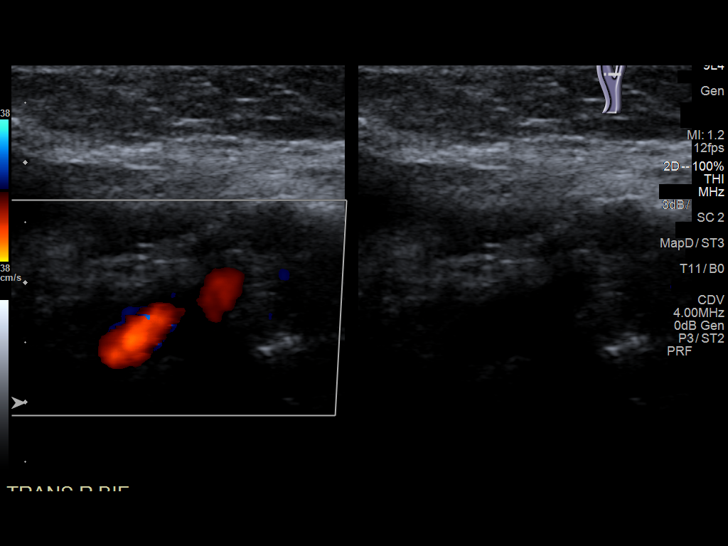
[im 23/66]
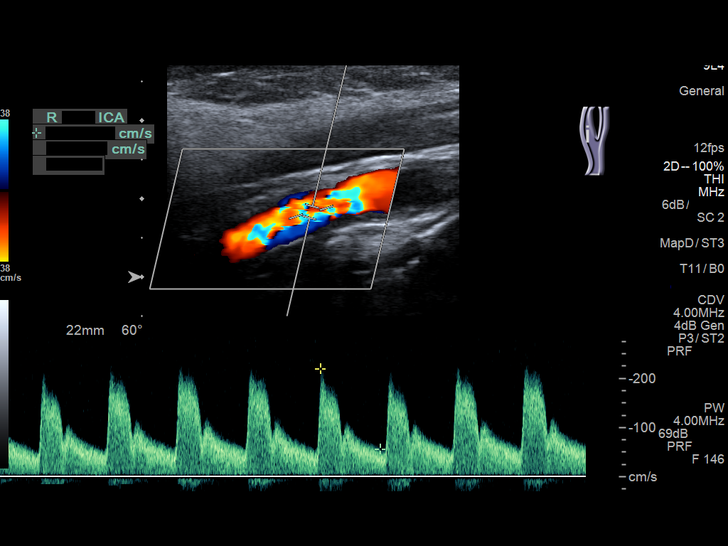
[im 29/66]
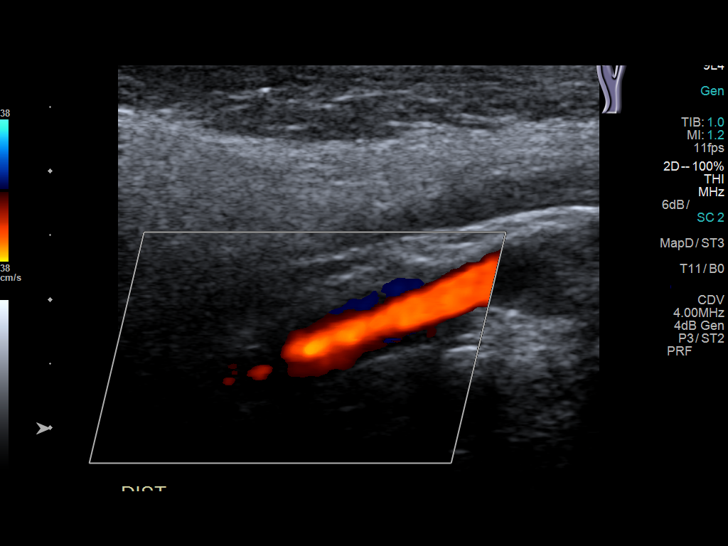
[im 34/66]
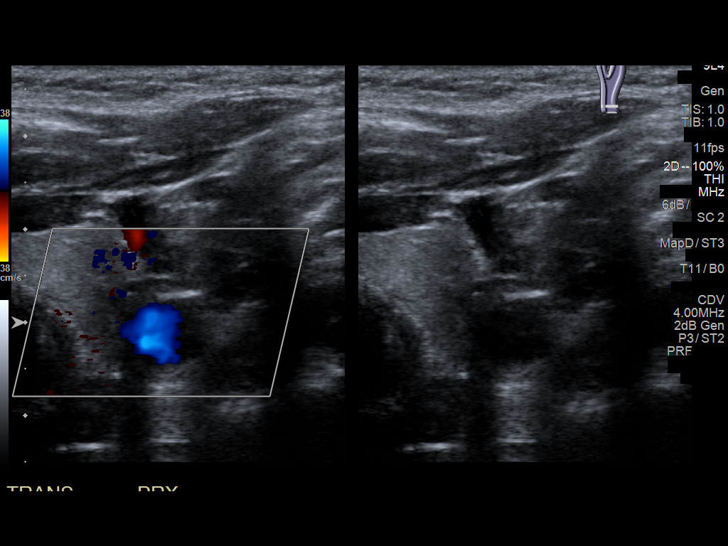
[im 37/66]
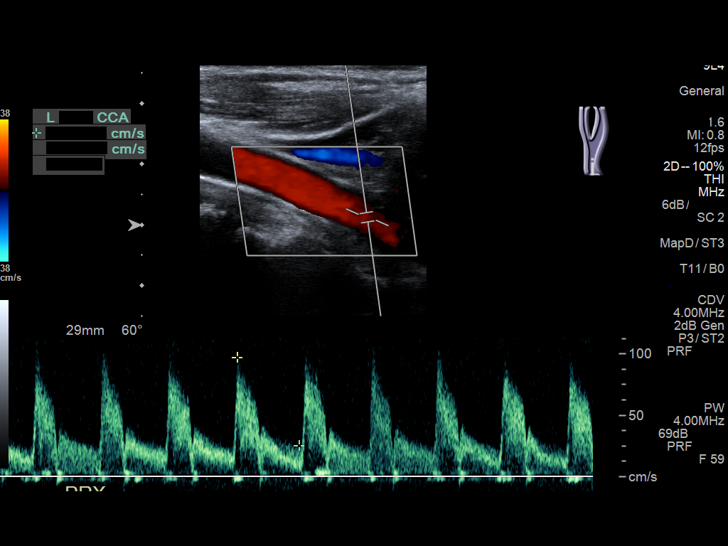
[im 43/66]
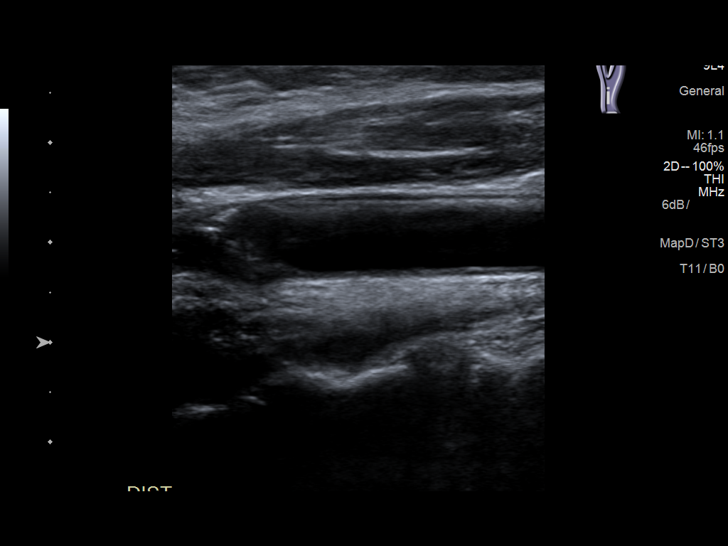
[im 49/66]
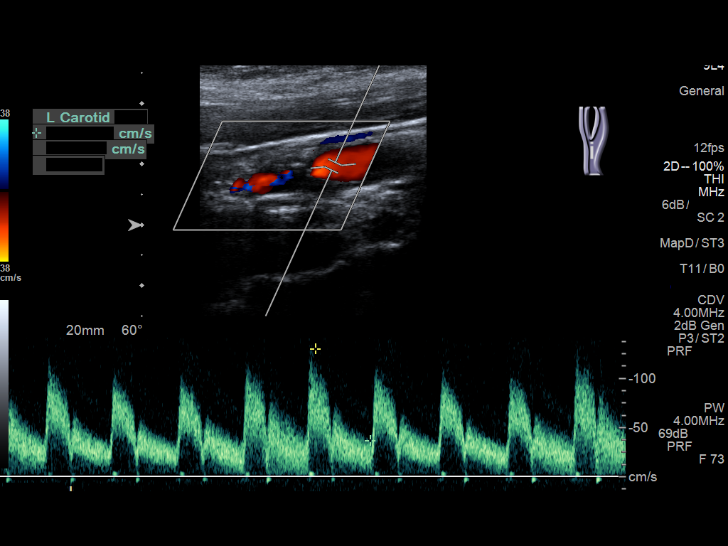
[im 54/66]
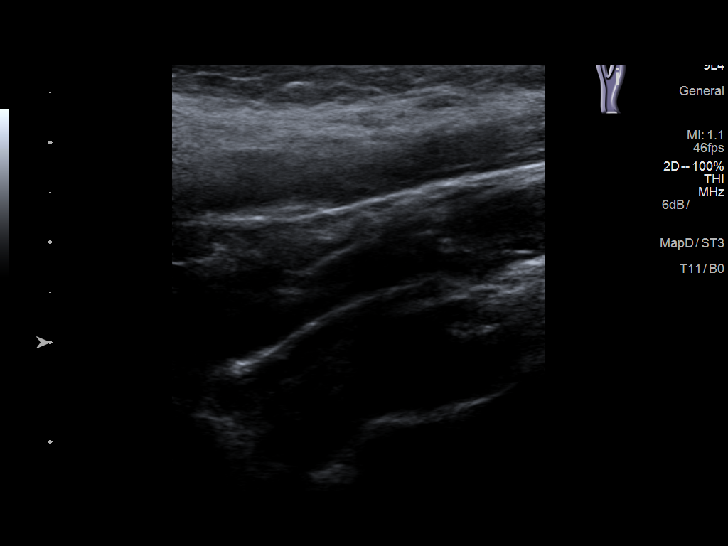
[im 60/66]
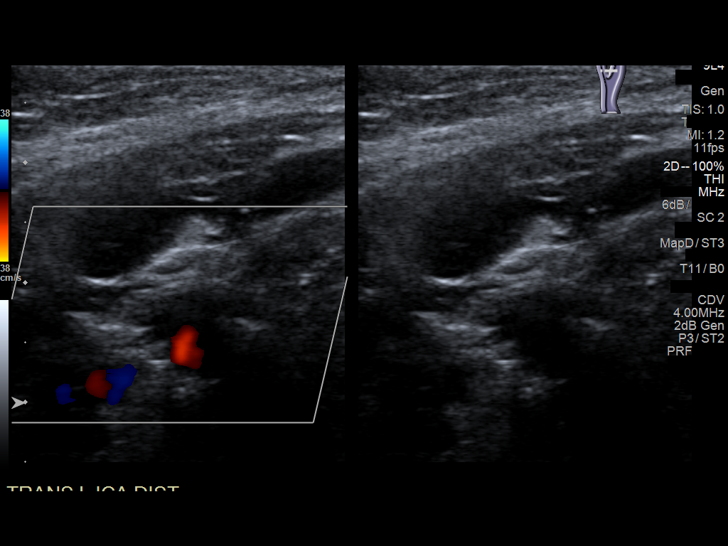
[im 66/66]
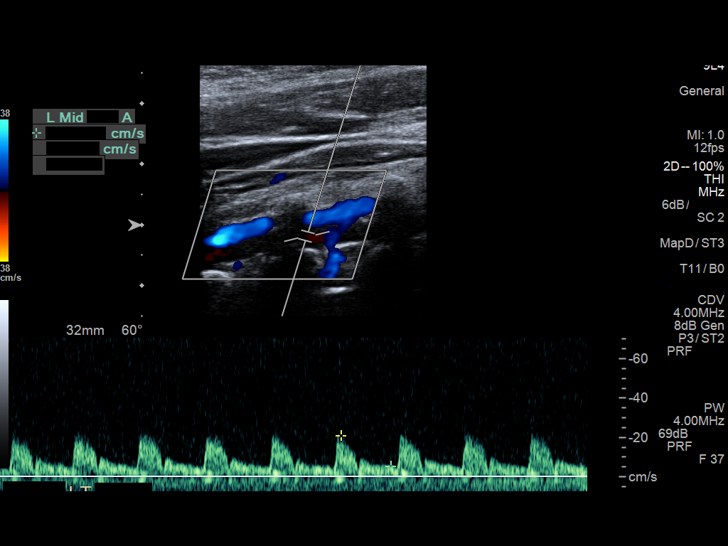

[13 of 24 positions shown; findings below may reference images not displayed]

FINDINGS: Criteria: Quantification of carotid stenosis is based on velocity
parameters that correlate the residual internal carotid diameter
with NASCET-based stenosis levels, using the diameter of the distal
internal carotid lumen as the denominator for stenosis measurement.

The following velocity measurements were obtained:

RIGHT

ICA:  220/56 cm/sec

CCA:  66/13 cm/sec

SYSTOLIC ICA/CCA RATIO:

ECA:  146 cm/sec

LEFT

ICA:  199/62 cm/sec

CCA:  75/21 cm/sec

SYSTOLIC ICA/CCA RATIO:

ECA:  465 cm/sec

RIGHT CAROTID ARTERY: There is a moderate to large amount of
eccentric hypoechoic plaque within the right carotid bulb (images 16
and 17), extending to involve the origin and proximal aspects of the
right internal carotid artery resulting in elevated peak systolic
velocities throughout the interrogated course the right internal
carotid artery. Greatest acquired peak systolic velocity with the
proximal right ICA measures 220 centimeters/second (image 24).

RIGHT VERTEBRAL ARTERY:  Antegrade Flow

LEFT CAROTID ARTERY: There is a moderate to large amount of
eccentric mixed echogenic plaque within the left carotid bulb
(images 47 and 48, extending to involve the origin and proximal
aspect the left internal carotid artery (image 55), resulting in
elevated peak systolic velocities throughout the interrogated course
of the left internal carotid artery. Greatest acquired peak systolic
velocity with the proximal left ICA measures 199 centimeters/second
(image 57).

LEFT VERTEBRAL ARTERY:  Antegrade Flow
IMPRESSION: Large amount of bilateral atherosclerotic plaque results in elevated
peak systolic velocities within the bilateral internal carotid
arteries compatible with the higher end of the 50-69% luminal
narrowing range, right greater than left. Further evaluation CTA
could be performed as clinically indicated.

## 2020-01-05 ENCOUNTER — Ambulatory Visit: Payer: Self-pay | Attending: Internal Medicine

## 2020-01-05 DIAGNOSIS — Z23 Encounter for immunization: Secondary | ICD-10-CM | POA: Insufficient documentation

## 2020-01-05 NOTE — Progress Notes (Signed)
Covid-19 Vaccination Clinic  Name:  ANNAGAIL TAKEMURA    MRN: [id number] DOB: 1956-09-01  01/05/2020  Ms. Lantagne was observed post Covid-19 immunization for 15 minutes without incident. She was provided with Vaccine Information Sheet and instruction to access the V-Safe system.   Ms. Rosser was instructed to call 911 with any severe reactions post vaccine: Marland Kitchen Difficulty breathing  . Swelling of face and throat  . A fast heartbeat  . A bad rash all over body  . Dizziness and weakness

## 2020-01-31 ENCOUNTER — Ambulatory Visit: Payer: Self-pay | Attending: Internal Medicine

## 2020-01-31 DIAGNOSIS — Z23 Encounter for immunization: Secondary | ICD-10-CM

## 2020-01-31 NOTE — Progress Notes (Signed)
Covid-19 Vaccination Clinic  Name:  Regina Moreno    MRN: [id number] DOB: [birth year]  01/31/2020  [name] was observed post Covid-19 immunization for 15 minutes without incident. She was provided with Vaccine Information Sheet and instruction to access the V-Safe system.   [name] was instructed to call 911 with any severe reactions post vaccine: Marland Kitchen Difficulty breathing  . Swelling of face and throat  . A fast heartbeat  . A bad rash all over body  . Dizziness and weakness   Immunizations Administered    Name Date Dose VIS Date Route   Pfizer COVID-19 Vaccine 01/31/2020 11:19 AM 0.3 mL 10/13/2019 Intramuscular   Manufacturer: [employer], [employer]   Lot: [id number]   NDC: [address]-4

## 2020-02-12 ENCOUNTER — Other Ambulatory Visit: Payer: Self-pay | Admitting: Internal Medicine

## 2020-02-12 DIAGNOSIS — Z1231 Encounter for screening mammogram for malignant neoplasm of breast: Secondary | ICD-10-CM

## 2020-02-14 ENCOUNTER — Ambulatory Visit
Admission: RE | Admit: 2020-02-14 | Discharge: 2020-02-14 | Disposition: A | Discharge status: Home / Self Care | Payer: BC Managed Care – PPO | Source: Ambulatory Visit | Attending: Internal Medicine | Admitting: Internal Medicine

## 2020-02-14 DIAGNOSIS — Z1231 Encounter for screening mammogram for malignant neoplasm of breast: Secondary | ICD-10-CM

## 2021-01-14 ENCOUNTER — Other Ambulatory Visit: Payer: Self-pay | Admitting: Internal Medicine

## 2021-01-14 DIAGNOSIS — Z1231 Encounter for screening mammogram for malignant neoplasm of breast: Secondary | ICD-10-CM

## 2021-02-14 ENCOUNTER — Other Ambulatory Visit: Payer: Self-pay

## 2021-02-14 ENCOUNTER — Ambulatory Visit
Admission: RE | Admit: 2021-02-14 | Discharge: 2021-02-14 | Disposition: A | Discharge status: Home / Self Care | Payer: Medicare HMO | Source: Ambulatory Visit | Attending: Internal Medicine | Admitting: Internal Medicine

## 2021-02-14 DIAGNOSIS — Z1231 Encounter for screening mammogram for malignant neoplasm of breast: Secondary | ICD-10-CM | POA: Diagnosis not present

## 2021-05-19 ENCOUNTER — Other Ambulatory Visit: Payer: Self-pay | Admitting: Obstetrics and Gynecology

## 2021-05-19 DIAGNOSIS — Z1231 Encounter for screening mammogram for malignant neoplasm of breast: Secondary | ICD-10-CM

## 2022-01-07 ENCOUNTER — Other Ambulatory Visit: Payer: Self-pay | Admitting: Internal Medicine

## 2022-01-07 DIAGNOSIS — Z1231 Encounter for screening mammogram for malignant neoplasm of breast: Secondary | ICD-10-CM

## 2022-02-17 ENCOUNTER — Ambulatory Visit
Admission: RE | Admit: 2022-02-17 | Discharge: 2022-02-17 | Disposition: A | Discharge status: Home / Self Care | Payer: Medicare HMO | Source: Ambulatory Visit | Attending: Internal Medicine | Admitting: Internal Medicine

## 2022-02-17 DIAGNOSIS — Z1231 Encounter for screening mammogram for malignant neoplasm of breast: Secondary | ICD-10-CM | POA: Diagnosis present

## 2023-01-11 ENCOUNTER — Other Ambulatory Visit: Payer: Self-pay | Admitting: Internal Medicine

## 2023-01-11 DIAGNOSIS — Z1231 Encounter for screening mammogram for malignant neoplasm of breast: Secondary | ICD-10-CM

## 2023-01-15 ENCOUNTER — Encounter (INDEPENDENT_AMBULATORY_CARE_PROVIDER_SITE_OTHER): Payer: Self-pay | Admitting: Ophthalmology

## 2023-01-21 NOTE — Progress Notes (Addendum)
[facility] Note  01/25/2023     CHIEF COMPLAINT Patient presents for Retina Evaluation   HISTORY OF PRESENT ILLNESS: Regina Moreno is a 67 y.o. female who presents to the clinic today for:   HPI     Retina Evaluation   In both eyes.  This started years ago.  Associated Symptoms Floaters and Distortion.  Negative for Flashes.  Treatments tried include no treatments.  I, the attending physician,  performed the HPI with the patient and updated documentation appropriately.        Comments   Patient was referred here by Dr. Ellin Mayhew for NPDR. Patient states that 6 weeks ago she started seeing floaters. She has a history of high blood pressure.       Last edited by Bernarda Caffey, MD on 01/25/2023  5:08 PM.    Pt is here on the referral of Dr. Ellin Mayhew for concern of floaters OS, pt states she started seeing new floaters last week, she has been seeing tiny black dots about 2 weeks ago, she had cataract sx OU about a year ago with Dr. Herbert Deaner and thought the black dots might have been from that sx, the new, larger floater she started seeing looked like a "contact" in her eye, pt states she is hypertensive and diabetic, she is on a new medication and states her A1c was 6.9 two weeks ago, pt states she was put on 40mg  losartan, which did not mix well with her diabetic medication, she states they lowered the dosage to 20mg  which did not help, so they took her off losartan and put her on something new this past week  Referring physician: Anell Barr, [place],  [place] [address]  HISTORICAL INFORMATION:   Selected notes from the MEDICAL RECORD NUMBER Referred by Dr. Orion Modest for retinal hemorrhages OU, diabetic eval LEE:  Ocular Hx- CEIOL OU 2023, [facility]-    CURRENT MEDICATIONS: No current outpatient medications on file. (Ophthalmic Drugs)   No current facility-administered medications for this visit. (Ophthalmic Drugs)   Current  Outpatient Medications (Other)  Medication Sig   albuterol (PROVENTIL HFA;VENTOLIN HFA) 108 (90 Base) MCG/ACT inhaler Inhale 2 puffs into the lungs every 6 (six) hours as needed for wheezing.   aspirin 81 MG tablet Take 4 tablets (325 mg total) by mouth daily.   atorvastatin (LIPITOR) 40 MG tablet Take 1 tablet (40 mg total) by mouth daily at 6 PM.   clopidogrel (PLAVIX) 75 MG tablet Take 1 tablet (75 mg total) by mouth daily.   glipiZIDE-metformin (METAGLIP) 2.5-500 MG per tablet Take 2 tablets by mouth 2 (two) times daily.   JARDIANCE 25 MG TABS tablet Take 25 mg by mouth daily.   olmesartan-hydrochlorothiazide (BENICAR HCT) 40-12.5 MG tablet Take 1 tablet by mouth daily.   omega-3 acid ethyl esters (LOVAZA) 1 g capsule Take 1 capsule (1 g total) by mouth 2 (two) times daily.   omeprazole (PRILOSEC) 20 MG capsule Take 1 capsule by mouth daily.   vitamin C (ASCORBIC ACID) 500 MG tablet Take 1,000 mg by mouth daily.   No current facility-administered medications for this visit. (Other)   REVIEW OF SYSTEMS: ROS   Positive for: Gastrointestinal, Endocrine, Eyes, Respiratory Negative for: Constitutional, Neurological, Skin, Genitourinary, Musculoskeletal, HENT, Cardiovascular, Psychiatric, Allergic/Imm, Heme/Lymph Last edited by Annie Paras, COT on 01/25/2023  9:00 AM.     ALLERGIES Allergies  Allergen Reactions   Ampicillin Nausea Only  Can take Augmentin   Fenofibrate Nausea Only   Simvastatin Nausea Only   Sulfa Antibiotics Hives    Other reaction(s): Unknown    PAST MEDICAL HISTORY Past Medical History:  Diagnosis Date   Diabetes mellitus without complication ([place])    Hypertension    Past Surgical History:  Procedure Laterality Date   ABDOMINAL HYSTERECTOMY     FAMILY HISTORY Family History  Problem Relation Age of Onset   CAD Mother    Diabetes Father    Breast cancer Neg Hx    SOCIAL HISTORY Social History   Tobacco Use   Smoking status: Former     Types: Cigarettes   Smokeless tobacco: Never  Substance Use Topics   Alcohol use: No   Drug use: No       OPHTHALMIC EXAM:  Base Eye Exam     Visual Acuity (Snellen - Linear)       Right Left   Dist [place] 20/20 20/20 -2         Tonometry (Tonopen, 9:02 AM)       Right Left   Pressure 15 14         Pupils       Dark Light Shape React APD   Right 3 2 Round Brisk None   Left 3 2 Round Brisk None         Visual Fields       Left Right    Full Full         Extraocular Movement       Right Left    Full, Ortho Full, Ortho         Neuro/Psych     Oriented x3: Yes         Dilation     Both eyes: 1.0% Mydriacyl, 2.5% Phenylephrine @ 9:00 AM           Slit Lamp and Fundus Exam     Slit Lamp Exam       Right Left   Lids/Lashes Dermatochalasis - upper lid, mild MGD Dermatochalasis - upper lid, mild MGD   Conjunctiva/Sclera White and quiet White and quiet   Cornea trace PEE, well healed cataract wound trace PEE, well healed cataract wound   Anterior Chamber deep and clear deep and clear   Iris Round and dilated, No NVI Round and dilated, No NVI   Lens PC IOL in good position PC IOL in good position   Anterior Vitreous syneresis Posterior vitreous detachment, Weiss ring         Fundus Exam       Right Left   Disc trace Pallor, Sharp rim trace Pallor, Sharp rim   C/D Ratio 0.4 0.4   Macula Flat, Good foveal reflex, scattered MA/DBH greatest nasal mac Flat, Good foveal reflex, scattered MA/DBH   Vessels attenuated, copper wiring, AV crossing changes, mild tortuosity attenuated, Tortuous, mild AV crossing changes   Periphery Attached, scattered MA/DBH greatest posteriorly Attached, scattered MA/DBH greatest posteriorly           Refraction     Wearing Rx       Sphere   Right OTC   Left OTC           IMAGING AND PROCEDURES  Imaging and Procedures for 01/25/2023  OCT, Retina - OU - Both Eyes       Right Eye Quality was  good. Central Foveal Thickness: 264. Progression has no prior data. Findings include normal foveal contour, no IRF,  no SRF, vitreomacular adhesion .   Left Eye Quality was good. Central Foveal Thickness: 264. Progression has no prior data. Findings include normal foveal contour, no IRF, no SRF (Focal PED temporal macula, mild vitreous opacities).   Notes *Images captured and stored on drive  Diagnosis / Impression:  NFP; no IRF/SRF OU OS: small focal PED temporal macula, mild vitreous opacities   Clinical management:  See below  Abbreviations: NFP - Normal foveal profile. CME - cystoid macular edema. PED - pigment epithelial detachment. IRF - intraretinal fluid. SRF - subretinal fluid. EZ - ellipsoid zone. ERM - epiretinal membrane. ORA - outer retinal atrophy. ORT - outer retinal tubulation. SRHM - subretinal hyper-reflective material. IRHM - intraretinal hyper-reflective material      Fluorescein Angiography Optos (Transit OS)       Right Eye Progression has no prior data. Early phase findings include microaneurysm. Mid/Late phase findings include leakage, microaneurysm (Scattered leaking MA greatest posteriorly).   Left Eye Progression has no prior data. Early phase findings include microaneurysm. Mid/Late phase findings include leakage, microaneurysm (Scattered leaking MA greatest posteriorly).   Notes **Images stored on drive**  Impression: Moderate NPDR OU Scattered leaking MA greatest posteriorly OU            ASSESSMENT/PLAN:    ICD-10-CM   1. Moderate nonproliferative diabetic retinopathy of both eyes without macular edema associated with type 2 diabetes mellitus (HCC)  [id number] OCT, Retina - OU - Both Eyes    2. Posterior vitreous detachment of left eye  H43.812     3. Essential hypertension  I10     4. Hypertensive retinopathy of both eyes  H35.033 Fluorescein Angiography Optos (Transit OS)    5. Pseudophakia, both eyes  Z96.1      1. Moderate  nonproliferative diabetic retinopathy w/o DME, both eyes - The incidence, risk factors for progression, natural history and treatment options for diabetic retinopathy were discussed with patient.   - The need for close monitoring of blood glucose, blood pressure, and serum lipids, avoiding cigarette or any type of tobacco, and the need for long term follow up was also discussed with patient. - BCVA 20/20 OU - exam shows scattered MA/DBH OU -- mostly posteriorly  - OCT without diabetic macular edema, both eyes  - FA 03.25.24 shows scattered leaking MA greatest posteriorly OU; no NV OU - f/u in 6 wks -- DFE/OCT, review FA  2. PVD / vitreous syneresis OS  - subacute, symptomatic floaters, onset 1-2 wks ago (~March 11) -- presented to Dr. Ellin Mayhew on 03.13.24  - Discussed findings and prognosis  - +Weiss ring but no RT or RD on 360 peripheral exam  - Reviewed s/s of RT/RD  - Strict return precautions for any such RT/RD signs/symptoms  - f/u in 6 wks -- DFE/OCT  3,4. Hypertensive retinopathy OU  - BP in office today (03.25.24) was 160s/80s - discussed importance of tight BP control and likely contribution to retinal hemorrhages - monitor  5. Pseudophakia OU  - s/p CE/IOL OU (Dr. Herbert Deaner, OD 03.07.23, OS 02.07.23)  - IOL in good position, doing well  - monitor  Ophthalmic Meds Ordered this visit:  No orders of the defined types were placed in this encounter.    Return in about 6 weeks (around 03/08/2023) for mod NPDR OU, Dilated Exam, OCT.  There are no Patient Instructions on file for this visit.   Explained the diagnoses, plan, and follow up with the patient and they expressed understanding.  Patient  expressed understanding of the importance of proper follow up care.   This document serves as a record of services personally performed by Gardiner Sleeper, MD, PhD. It was created on their behalf by Roselee Nova, COMT. The creation of this record is the provider's dictation and/or  activities during the visit.  Electronically signed by: Roselee Nova, COMT 01/25/23 5:10 PM  This document serves as a record of services personally performed by Gardiner Sleeper, MD, PhD. It was created on their behalf by San Jetty. Owens Shark, OA an ophthalmic technician. The creation of this record is the provider's dictation and/or activities during the visit.    Electronically signed by: San Jetty. Owens Shark, OA @TODAY @ 5:10 PM  Gardiner Sleeper, M.D., Ph.D. Diseases & Surgery of the Retina and Vitreous [facility]  I have reviewed the above documentation for accuracy and completeness, and I agree with the above. Gardiner Sleeper, M.D., Ph.D. 01/25/23 5:18 PM  Abbreviations: M myopia (nearsighted); A astigmatism; H hyperopia (farsighted); P presbyopia; Mrx spectacle prescription;  CTL contact lenses; OD right eye; OS left eye; OU both eyes  XT exotropia; ET esotropia; PEK punctate epithelial keratitis; PEE punctate epithelial erosions; DES dry eye syndrome; MGD meibomian gland dysfunction; ATs artificial tears; PFAT's preservative free artificial tears; [place] nuclear sclerotic cataract; PSC posterior subcapsular cataract; ERM epi-retinal membrane; PVD posterior vitreous detachment; RD retinal detachment; DM diabetes mellitus; DR diabetic retinopathy; NPDR non-proliferative diabetic retinopathy; PDR proliferative diabetic retinopathy; CSME clinically significant macular edema; DME diabetic macular edema; dbh dot blot hemorrhages; CWS cotton wool spot; POAG primary open angle glaucoma; C/D cup-to-disc ratio; HVF humphrey visual field; GVF goldmann visual field; OCT optical coherence tomography; IOP intraocular pressure; BRVO Branch retinal vein occlusion; CRVO central retinal vein occlusion; CRAO central retinal artery occlusion; BRAO branch retinal artery occlusion; RT retinal tear; SB scleral buckle; PPV pars plana vitrectomy; VH Vitreous hemorrhage; PRP panretinal laser photocoagulation;  IVK intravitreal kenalog; VMT vitreomacular traction; MH Macular hole;  NVD neovascularization of the disc; NVE neovascularization elsewhere; AREDS age related eye disease study; ARMD age related macular degeneration; POAG primary open angle glaucoma; EBMD epithelial/anterior basement membrane dystrophy; ACIOL anterior chamber intraocular lens; IOL intraocular lens; PCIOL posterior chamber intraocular lens; Phaco/IOL phacoemulsification with intraocular lens placement; [place] photorefractive keratectomy; LASIK laser assisted in situ keratomileusis; HTN hypertension; DM diabetes mellitus; COPD chronic obstructive pulmonary disease

## 2023-01-25 ENCOUNTER — Ambulatory Visit (INDEPENDENT_AMBULATORY_CARE_PROVIDER_SITE_OTHER): Payer: Medicare HMO | Admitting: Ophthalmology

## 2023-01-25 ENCOUNTER — Encounter (INDEPENDENT_AMBULATORY_CARE_PROVIDER_SITE_OTHER): Payer: Self-pay | Admitting: Ophthalmology

## 2023-01-25 DIAGNOSIS — E113393 Type 2 diabetes mellitus with moderate nonproliferative diabetic retinopathy without macular edema, bilateral: Secondary | ICD-10-CM | POA: Diagnosis not present

## 2023-01-25 DIAGNOSIS — I10 Essential (primary) hypertension: Secondary | ICD-10-CM | POA: Diagnosis not present

## 2023-01-25 DIAGNOSIS — H35033 Hypertensive retinopathy, bilateral: Secondary | ICD-10-CM | POA: Diagnosis not present

## 2023-01-25 DIAGNOSIS — H43812 Vitreous degeneration, left eye: Secondary | ICD-10-CM

## 2023-01-25 DIAGNOSIS — Z961 Presence of intraocular lens: Secondary | ICD-10-CM

## 2023-02-24 ENCOUNTER — Ambulatory Visit
Admission: RE | Admit: 2023-02-24 | Discharge: 2023-02-24 | Disposition: A | Discharge status: Home / Self Care | Payer: Medicare HMO | Source: Ambulatory Visit | Attending: Internal Medicine | Admitting: Internal Medicine

## 2023-02-24 DIAGNOSIS — Z1231 Encounter for screening mammogram for malignant neoplasm of breast: Secondary | ICD-10-CM | POA: Insufficient documentation

## 2023-03-02 NOTE — Progress Notes (Signed)
[facility] - Clinic Note  03/08/2023     CHIEF COMPLAINT Patient presents for Retina Follow Up   HISTORY OF PRESENT ILLNESS: Regina Moreno is a 67 y.o. female who presents to the clinic today for:   HPI     Retina Follow Up   Patient presents with  Diabetic Retinopathy.  In both eyes.  This started 6 weeks ago.  I, the attending physician,  performed the HPI with the patient and updated documentation appropriately.        Comments   Patient here for 6 weeks retina follow up for NPDR OU. Patient states vision is ok. Still has floaters right now. Not totally gone away. No eye pain.       Last edited by Rennis Chris, MD on 03/10/2023  5:43 AM.    Pt states she is still seeing floaters in the left eye, she has seen one fol a couple weeks ago, pt states she has a dexcom monitor which is helping her blood sugar levels   Referring physician: Isla Pence, OD [address] [place],  [place] [address]  HISTORICAL INFORMATION:   Selected notes from the MEDICAL RECORD NUMBER Referred by Dr. Janee Morn for retinal hemorrhages OU, diabetic eval LEE:  Ocular Hx- CEIOL OU 2023, [facility]-    CURRENT MEDICATIONS: No current outpatient medications on file. (Ophthalmic Drugs)   No current facility-administered medications for this visit. (Ophthalmic Drugs)   Current Outpatient Medications (Other)  Medication Sig   aspirin 81 MG tablet Take 4 tablets (325 mg total) by mouth daily.   atorvastatin (LIPITOR) 40 MG tablet Take 1 tablet (40 mg total) by mouth daily at 6 PM.   clopidogrel (PLAVIX) 75 MG tablet Take 1 tablet (75 mg total) by mouth daily.   glipiZIDE-metformin (METAGLIP) 2.5-500 MG per tablet Take 2 tablets by mouth 2 (two) times daily.   JARDIANCE 25 MG TABS tablet Take 25 mg by mouth daily.   olmesartan-hydrochlorothiazide (BENICAR HCT) 40-12.5 MG tablet Take 1 tablet by mouth daily.   omega-3 acid ethyl esters (LOVAZA) 1 g capsule Take 1  capsule (1 g total) by mouth 2 (two) times daily.   omeprazole (PRILOSEC) 20 MG capsule Take 1 capsule by mouth daily.   vitamin C (ASCORBIC ACID) 500 MG tablet Take 1,000 mg by mouth daily.   albuterol (PROVENTIL HFA;VENTOLIN HFA) 108 (90 Base) MCG/ACT inhaler Inhale 2 puffs into the lungs every 6 (six) hours as needed for wheezing.   No current facility-administered medications for this visit. (Other)   REVIEW OF SYSTEMS: ROS   Positive for: Gastrointestinal, Endocrine, Eyes, Respiratory Negative for: Constitutional, Neurological, Skin, Genitourinary, Musculoskeletal, HENT, Cardiovascular, Psychiatric, Allergic/Imm, Heme/Lymph Last edited by Laddie Aquas, COA on 03/08/2023  8:49 AM.     ALLERGIES Allergies  Allergen Reactions   Ampicillin Nausea Only    Can take Augmentin   Fenofibrate Nausea Only   Simvastatin Nausea Only   Sulfa Antibiotics Hives    Other reaction(s): Unknown    PAST MEDICAL HISTORY Past Medical History:  Diagnosis Date   Diabetes mellitus without complication (HCC)    Hypertension    Past Surgical History:  Procedure Laterality Date   ABDOMINAL HYSTERECTOMY     FAMILY HISTORY Family History  Problem Relation Age of Onset   CAD Mother    Diabetes Father    Breast cancer Neg Hx    SOCIAL HISTORY Social History   Tobacco Use  Smoking status: Former    Types: Cigarettes   Smokeless tobacco: Never  Vaping Use   Vaping Use: Never used  Substance Use Topics   Alcohol use: No   Drug use: No       OPHTHALMIC EXAM:  Base Eye Exam     Visual Acuity (Snellen - Linear)       Right Left   Dist [place] 20/20 20/25 +2   Dist ph [place]  20/20         Tonometry (Tonopen, 8:47 AM)       Right Left   Pressure 13 12         Pupils       Dark Light Shape React APD   Right 3 2 Round Brisk None   Left 3 2 Round Brisk None         Visual Fields (Counting fingers)       Left Right    Full Full         Extraocular Movement        Right Left    Full, Ortho Full, Ortho         Neuro/Psych     Oriented x3: Yes   Mood/Affect: Normal         Dilation     Both eyes: 1.0% Mydriacyl, 2.5% Phenylephrine @ 8:47 AM           Slit Lamp and Fundus Exam     Slit Lamp Exam       Right Left   Lids/Lashes Dermatochalasis - upper lid, mild MGD Dermatochalasis - upper lid, mild MGD   Conjunctiva/Sclera White and quiet White and quiet   Cornea 2+ PEE, well healed cataract wound 1+ PEE, well healed cataract wound, mild tear film debris   Anterior Chamber deep and clear deep and clear   Iris Round and dilated, No NVI Round and dilated, No NVI   Lens PC IOL in good position PC IOL in good position   Anterior Vitreous syneresis Posterior vitreous detachment, Weiss ring, mild syneresis         Fundus Exam       Right Left   Disc trace Pallor, Sharp rim trace Pallor, Sharp rim   C/D Ratio 0.4 0.4   Macula Flat, Good foveal reflex, scattered MA/DBH greatest nasal mac Flat, Good foveal reflex, scattered MA/DBH   Vessels attenuated, copper wiring, AV crossing changes, mild tortuosity attenuated, Tortuous, mild AV crossing changes   Periphery Attached, scattered MA/DBH greatest posteriorly Attached, scattered MA/DBH greatest posteriorly           Refraction     Wearing Rx       Sphere   Right OTC   Left OTC           IMAGING AND PROCEDURES  Imaging and Procedures for 03/08/2023  OCT, Retina - OU - Both Eyes       Right Eye Quality was good. Central Foveal Thickness: 263. Progression has been stable. Findings include normal foveal contour, no IRF, no SRF, vitreomacular adhesion .   Left Eye Quality was good. Central Foveal Thickness: 263. Progression has been stable. Findings include normal foveal contour, no IRF, no SRF (Focal PED temporal macula, mild vitreous opacities -- slightly improved).   Notes *Images captured and stored on drive  Diagnosis / Impression:  NFP; no IRF/SRF OU OS: small  focal PED temporal macula, mild vitreous opacities -- slightly improved   Clinical management:  See below  Abbreviations: NFP - Normal foveal profile. CME - cystoid macular edema. PED - pigment epithelial detachment. IRF - intraretinal fluid. SRF - subretinal fluid. EZ - ellipsoid zone. ERM - epiretinal membrane. ORA - outer retinal atrophy. ORT - outer retinal tubulation. SRHM - subretinal hyper-reflective material. IRHM - intraretinal hyper-reflective material            ASSESSMENT/PLAN:    ICD-10-CM   1. Moderate nonproliferative diabetic retinopathy of both eyes without macular edema associated with type 2 diabetes mellitus (HCC)  E11.3393 OCT, Retina - OU - Both Eyes    2. Long term (current) use of oral hypoglycemic drugs  Z79.84     3. Posterior vitreous detachment of left eye  H43.812     4. Essential hypertension  I10     5. Hypertensive retinopathy of both eyes  H35.033     6. Pseudophakia, both eyes  Z96.1       1,2. Moderate nonproliferative diabetic retinopathy w/o DME, both eyes - BCVA remains 20/20 OU - exam shows scattered MA/DBH OU -- mostly posteriorly  - OCT without diabetic macular edema, both eyes  - FA 03.25.24 shows scattered leaking MA greatest posteriorly OU; no NV OU - f/u in 9 mos -- DFE/OCT  3. PVD / vitreous syneresis OS  - subacute, symptomatic floaters, onset ~early March 2024 -- presented to Dr. Clydene Pugh on 03.13.24  - floaters improved today but still some present  - Discussed findings and prognosis  - +Weiss ring but no RT or RD on 360 peripheral exam  - Reviewed s/s of RT/RD  - Strict return precautions for any such RT/RD signs/symptoms  - f/u in 9 mos DFE, OCT  4,5. Hypertensive retinopathy OU  - BP in office on 03.25.24 was 160s/80s - discussed importance of tight BP control and likely contribution to retinal hemorrhages - working on changing meds with PCP - monitor  6. Pseudophakia OU  - s/p CE/IOL OU (Dr. Elmer Picker, OD 03.07.23,  OS 02.07.23)  - IOL in good position, doing well  - monitor  Ophthalmic Meds Ordered this visit:  No orders of the defined types were placed in this encounter.    Return in about 9 months (around 12/09/2023) for mod NPDR OU, Dilated Exam, OCT, Possible Injxn.  There are no Patient Instructions on file for this visit.   Explained the diagnoses, plan, and follow up with the patient and they expressed understanding.  Patient expressed understanding of the importance of proper follow up care.   This document serves as a record of services personally performed by Karie Chimera, MD, PhD. It was created on their behalf by Annalee Genta, COMT. The creation of this record is the provider's dictation and/or activities during the visit.  Electronically signed by: Annalee Genta, COMT 03/10/23 5:45 AM  Karie Chimera, M.D., Ph.D. Diseases & Surgery of the Retina and Vitreous [facility]  I have reviewed the above documentation for accuracy and completeness, and I agree with the above. Karie Chimera, M.D., Ph.D. 03/10/23 5:47 AM   Abbreviations: M myopia (nearsighted); A astigmatism; H hyperopia (farsighted); P presbyopia; Mrx spectacle prescription;  CTL contact lenses; OD right eye; OS left eye; OU both eyes  XT exotropia; ET esotropia; PEK punctate epithelial keratitis; PEE punctate epithelial erosions; DES dry eye syndrome; MGD meibomian gland dysfunction; ATs artificial tears; PFAT's preservative free artificial tears; NSC nuclear sclerotic cataract; PSC posterior subcapsular cataract; ERM epi-retinal membrane; PVD posterior vitreous detachment; RD retinal  detachment; DM diabetes mellitus; DR diabetic retinopathy; NPDR non-proliferative diabetic retinopathy; PDR proliferative diabetic retinopathy; CSME clinically significant macular edema; DME diabetic macular edema; dbh dot blot hemorrhages; CWS cotton wool spot; POAG primary open angle glaucoma; C/D cup-to-disc ratio; HVF  humphrey visual field; GVF goldmann visual field; OCT optical coherence tomography; IOP intraocular pressure; BRVO Branch retinal vein occlusion; CRVO central retinal vein occlusion; CRAO central retinal artery occlusion; BRAO branch retinal artery occlusion; RT retinal tear; SB scleral buckle; PPV pars plana vitrectomy; VH Vitreous hemorrhage; PRP panretinal laser photocoagulation; IVK intravitreal kenalog; VMT vitreomacular traction; MH Macular hole;  NVD neovascularization of the disc; NVE neovascularization elsewhere; AREDS age related eye disease study; ARMD age related macular degeneration; POAG primary open angle glaucoma; EBMD epithelial/anterior basement membrane dystrophy; ACIOL anterior chamber intraocular lens; IOL intraocular lens; PCIOL posterior chamber intraocular lens; Phaco/IOL phacoemulsification with intraocular lens placement; PRK photorefractive keratectomy; LASIK laser assisted in situ keratomileusis; HTN hypertension; DM diabetes mellitus; COPD chronic obstructive pulmonary disease

## 2023-03-08 ENCOUNTER — Encounter (INDEPENDENT_AMBULATORY_CARE_PROVIDER_SITE_OTHER): Payer: Self-pay | Admitting: Ophthalmology

## 2023-03-08 ENCOUNTER — Ambulatory Visit (INDEPENDENT_AMBULATORY_CARE_PROVIDER_SITE_OTHER): Payer: Medicare HMO | Admitting: Ophthalmology

## 2023-03-08 DIAGNOSIS — H43812 Vitreous degeneration, left eye: Secondary | ICD-10-CM

## 2023-03-08 DIAGNOSIS — E113393 Type 2 diabetes mellitus with moderate nonproliferative diabetic retinopathy without macular edema, bilateral: Secondary | ICD-10-CM

## 2023-03-08 DIAGNOSIS — Z7984 Long term (current) use of oral hypoglycemic drugs: Secondary | ICD-10-CM

## 2023-03-08 DIAGNOSIS — I10 Essential (primary) hypertension: Secondary | ICD-10-CM

## 2023-03-08 DIAGNOSIS — Z961 Presence of intraocular lens: Secondary | ICD-10-CM

## 2023-03-08 DIAGNOSIS — H35033 Hypertensive retinopathy, bilateral: Secondary | ICD-10-CM

## 2023-03-10 ENCOUNTER — Encounter (INDEPENDENT_AMBULATORY_CARE_PROVIDER_SITE_OTHER): Payer: Self-pay | Admitting: Ophthalmology

## 2023-05-18 ENCOUNTER — Other Ambulatory Visit: Payer: Self-pay

## 2023-05-18 ENCOUNTER — Telehealth: Payer: Self-pay

## 2023-05-18 NOTE — Telephone Encounter (Signed)
Patient called in wanting to schedule for her colonoscopy. Her provider has retired, and the patient said that [facility] said that they sent over her referral. I checked the referral basket and there is no referral I advised her that she will need them to send [place] one.

## 2023-06-25 ENCOUNTER — Other Ambulatory Visit: Payer: Self-pay | Admitting: *Deleted

## 2023-06-25 ENCOUNTER — Telehealth: Payer: Self-pay | Admitting: *Deleted

## 2023-06-25 DIAGNOSIS — Z1211 Encounter for screening for malignant neoplasm of colon: Secondary | ICD-10-CM

## 2023-06-25 MED ORDER — NA SULFATE-K SULFATE-MG SULF 17.5-3.13-1.6 GM/177ML PO SOLN: 1.0000 | Freq: Once | ORAL | 0 refills | Status: AC

## 2023-06-25 NOTE — Telephone Encounter (Signed)
Gastroenterology Pre-Procedure Review  Request Date: 08/09/2023 Requesting Physician: Dr. Tobi Bastos  PATIENT REVIEW QUESTIONS: The patient responded to the following health history questions as indicated:    1. Are you having any GI issues? no 2. Do you have a personal history of Polyps? no 3. Do you have a family history of Colon Cancer or Polyps? no 4. Diabetes Mellitus? yes (Metaglip and Jardiance) 5. Joint replacements in the past 12 months?no 6. Major health problems in the past 3 months?no 7. Any artificial heart valves, MVP, or defibrillator?no    MEDICATIONS & ALLERGIES:    Patient reports the following regarding taking any anticoagulation/antiplatelet therapy:   Plavix, Coumadin, Eliquis, Xarelto, Lovenox, Pradaxa, Brilinta, or Effient? yes (Plavix 75 mg) Aspirin? yes (81 mg)  Patient confirms/reports the following medications:  Current Outpatient Medications  Medication Sig Dispense Refill   Na Sulfate-K Sulfate-Mg Sulf 17.5-3.13-1.6 GM/177ML SOLN Take 1 kit by mouth once for 1 dose. 354 mL 0   albuterol (PROVENTIL HFA;VENTOLIN HFA) 108 (90 Base) MCG/ACT inhaler Inhale 2 puffs into the lungs every 6 (six) hours as needed for wheezing.     aspirin 81 MG tablet Take 4 tablets (325 mg total) by mouth daily. 30 tablet 2   atorvastatin (LIPITOR) 40 MG tablet Take 1 tablet (40 mg total) by mouth daily at 6 PM. 30 tablet 2   clopidogrel (PLAVIX) 75 MG tablet Take 1 tablet (75 mg total) by mouth daily. 30 tablet 2   glipiZIDE-metformin (METAGLIP) 2.5-500 MG per tablet Take 2 tablets by mouth 2 (two) times daily.     JARDIANCE 25 MG TABS tablet Take 25 mg by mouth daily.  0   olmesartan-hydrochlorothiazide (BENICAR HCT) 40-12.5 MG tablet Take 1 tablet by mouth daily.  3   omega-3 acid ethyl esters (LOVAZA) 1 g capsule Take 1 capsule (1 g total) by mouth 2 (two) times daily. 60 capsule 2   omeprazole (PRILOSEC) 20 MG capsule Take 1 capsule by mouth daily.     vitamin C (ASCORBIC ACID) 500  MG tablet Take 1,000 mg by mouth daily.     No current facility-administered medications for this visit.    Patient confirms/reports the following allergies:  Allergies  Allergen Reactions   Ampicillin Nausea Only    Can take Augmentin   Fenofibrate Nausea Only   Simvastatin Nausea Only   Sulfa Antibiotics Hives    Other reaction(s): Unknown    No orders of the defined types were placed in this encounter.   AUTHORIZATION INFORMATION Primary Insurance: 1D#: Group #:  Secondary Insurance: 1D#: Group #:  SCHEDULE INFORMATION: Date: 08/09/2023 Time: Location:  [facility]

## 2023-06-30 ENCOUNTER — Encounter: Payer: Self-pay | Admitting: Podiatry

## 2023-06-30 ENCOUNTER — Ambulatory Visit: Payer: Medicare HMO | Admitting: Podiatry

## 2023-06-30 DIAGNOSIS — L603 Nail dystrophy: Secondary | ICD-10-CM | POA: Diagnosis not present

## 2023-06-30 NOTE — Progress Notes (Signed)
Subjective:  Patient ID: Regina Moreno, female    DOB: October 27, 1956,  MRN: [id number] HPI Chief Complaint  Patient presents with   Nail Problem    Toenails bilateral - thick, discolored x 1 year-ish, tried OTC meds-no help   New Patient (Initial Visit)    67 y.o. female presents with the above complaint.   ROS: Denies fever chills nausea vomiting muscle aches pains calf pain back pain chest pain shortness of breath.  Past Medical History:  Diagnosis Date   Diabetes mellitus without complication (HCC)    Hypertension    Past Surgical History:  Procedure Laterality Date   ABDOMINAL HYSTERECTOMY      Current Outpatient Medications:    albuterol (PROVENTIL HFA;VENTOLIN HFA) 108 (90 Base) MCG/ACT inhaler, Inhale 2 puffs into the lungs every 6 (six) hours as needed for wheezing., Disp: , Rfl:    amLODipine (NORVASC) 2.5 MG tablet, Take 2.5 mg by mouth daily., Disp: , Rfl:    aspirin 81 MG tablet, Take 4 tablets (325 mg total) by mouth daily., Disp: 30 tablet, Rfl: 2   atorvastatin (LIPITOR) 40 MG tablet, Take 1 tablet (40 mg total) by mouth daily at 6 PM., Disp: 30 tablet, Rfl: 2   B-D ULTRAFINE III SHORT PEN 31G X 8 MM MISC, See admin instructions., Disp: , Rfl:    carvedilol (COREG) 6.25 MG tablet, Take 6.25 mg by mouth 2 (two) times daily., Disp: , Rfl:    clopidogrel (PLAVIX) 75 MG tablet, Take 1 tablet (75 mg total) by mouth daily., Disp: 30 tablet, Rfl: 2   glipiZIDE-metformin (METAGLIP) 2.5-500 MG per tablet, Take 2 tablets by mouth 2 (two) times daily., Disp: , Rfl:    JARDIANCE 25 MG TABS tablet, Take 25 mg by mouth daily., Disp: , Rfl: 0   metFORMIN (GLUCOPHAGE) 1000 MG tablet, Take 1,000 mg by mouth 2 (two) times daily., Disp: , Rfl:    olmesartan-hydrochlorothiazide (BENICAR HCT) 40-12.5 MG tablet, Take 1 tablet by mouth daily., Disp: , Rfl: 3   omega-3 acid ethyl esters (LOVAZA) 1 g capsule, Take 1 capsule (1 g total) by mouth 2 (two) times daily., Disp: 60 capsule, Rfl:  2   omeprazole (PRILOSEC) 20 MG capsule, Take 1 capsule by mouth daily., Disp: , Rfl:    vitamin C (ASCORBIC ACID) 500 MG tablet, Take 1,000 mg by mouth daily., Disp: , Rfl:   Allergies  Allergen Reactions   Ampicillin Nausea Only    Can take Augmentin   Fenofibrate Nausea Only   Simvastatin Nausea Only   Sulfa Antibiotics Hives    Other reaction(s): Unknown   Tramadol Nausea Only    Makes her very dizzy and nauseated   Review of Systems Objective:  There were no vitals filed for this visit.  General: Well developed, nourished, in no acute distress, alert and oriented x3   Dermatological: Skin is warm, dry and supple bilateral. Nails x are thick yellow dystrophic clinically mycotic with subungual debris.  Remaining integument appears unremarkable at this time. There are no open sores, no preulcerative lesions, no rash or signs of infection present.  Vascular: Dorsalis Pedis artery and Posterior Tibial artery pedal pulses are 2/4 bilateral with immedate capillary fill time. Pedal hair growth present. No varicosities and no lower extremity edema present bilateral.   Neruologic: Grossly intact via light touch bilateral. Vibratory intact via tuning fork bilateral. Protective threshold with Semmes Wienstein monofilament intact to all pedal sites bilateral. Patellar and Achilles deep tendon reflexes 2+ bilateral.  No Babinski or clonus noted bilateral.   Musculoskeletal: No gross boney pedal deformities bilateral. No pain, crepitus, or limitation noted with foot and ankle range of motion bilateral. Muscular strength 5/5 in all groups tested bilateral.  Gait: Unassisted, Nonantalgic.    Radiographs:  None taken  Assessment & Plan:   Assessment: Nail dystrophy  Plan: Sepsis of the skin and nail were taken today to be sent for pathologic evaluation.  I will follow-up with her in 1 month     Mirelle Biskup [place], [place]

## 2023-07-26 ENCOUNTER — Telehealth: Payer: Self-pay | Admitting: *Deleted

## 2023-07-26 NOTE — Telephone Encounter (Signed)
Received fax from Dr Judithann Sheen' office.  Patient is cleared to have procedure and should stop taking Plavix 5 days prior and restart 1 day after.  Colonoscopy is schedule on 08/09/2023 with Dr Tobi Bastos.  Need to call patient on Monday 08/02/2023 regarding information above.

## 2023-07-28 ENCOUNTER — Ambulatory Visit: Payer: Medicare HMO | Admitting: Podiatry

## 2023-07-28 ENCOUNTER — Encounter: Payer: Self-pay | Admitting: Podiatry

## 2023-07-28 DIAGNOSIS — L603 Nail dystrophy: Secondary | ICD-10-CM | POA: Diagnosis not present

## 2023-07-28 MED ORDER — TERBINAFINE HCL 250 MG PO TABS: 250.0000 mg | ORAL_TABLET | Freq: Every day | ORAL | 0 refills | Status: DC

## 2023-07-28 NOTE — Progress Notes (Signed)
She presents today to discuss the findings of her pathology results regarding her toenails.  Denies any changes.  Objective: No change in physical exam pathology does demonstrate saprophytic fungus and nail dystrophy.  Assessment: Saprophytic fungus nail dystrophy.  Plan: We discussed the topical therapy oral therapy and laser therapy.  At this point she would like to start with oral therapy.  We will start her on Lamisil tablets 250 mg.  She will take 1 tablet daily.  We discussed the pros and cons of use of this medication the possible side effects associated with that she understands that and is amenable to it.  I will follow-up with her in 30 days for blood work.  Her past blood work performed earlier this month demonstrates normal liver function test.

## 2023-08-02 NOTE — Telephone Encounter (Signed)
Patient called in today asking if she was supposed to stop her Plavix before her colonoscopy. Informed her the instructions from Dr. Judithann Sheen office and she verbalized understanding of instructions.

## 2023-08-06 ENCOUNTER — Encounter: Payer: Self-pay | Admitting: Gastroenterology

## 2023-08-09 ENCOUNTER — Encounter: Payer: Self-pay | Admitting: Gastroenterology

## 2023-08-09 ENCOUNTER — Ambulatory Visit
Admission: RE | Admit: 2023-08-09 | Discharge: 2023-08-09 | Disposition: A | Discharge status: Home / Self Care | Payer: Medicare HMO | Source: Ambulatory Visit | Attending: Gastroenterology | Admitting: Gastroenterology

## 2023-08-09 ENCOUNTER — Ambulatory Visit: Payer: Medicare HMO | Admitting: Registered Nurse

## 2023-08-09 ENCOUNTER — Other Ambulatory Visit: Payer: Self-pay

## 2023-08-09 ENCOUNTER — Encounter
Admission: RE | Disposition: A | Discharge status: Home / Self Care | Payer: Self-pay | Source: Ambulatory Visit | Attending: Gastroenterology

## 2023-08-09 DIAGNOSIS — K635 Polyp of colon: Secondary | ICD-10-CM | POA: Diagnosis not present

## 2023-08-09 DIAGNOSIS — D125 Benign neoplasm of sigmoid colon: Secondary | ICD-10-CM | POA: Diagnosis not present

## 2023-08-09 DIAGNOSIS — Z1211 Encounter for screening for malignant neoplasm of colon: Secondary | ICD-10-CM

## 2023-08-09 DIAGNOSIS — D126 Benign neoplasm of colon, unspecified: Secondary | ICD-10-CM

## 2023-08-09 HISTORY — PX: COLONOSCOPY WITH PROPOFOL: SHX5780

## 2023-08-09 LAB — GLUCOSE, CAPILLARY: Glucose-Capillary: 118 mg/dL — ABNORMAL HIGH (ref 70–99)

## 2023-08-09 SURGERY — COLONOSCOPY WITH PROPOFOL
Anesthesia: General

## 2023-08-09 MED ORDER — PROPOFOL 10 MG/ML IV BOLUS
INTRAVENOUS | Status: DC | PRN
  Administered 2023-08-09: 40 mg via INTRAVENOUS

## 2023-08-09 MED ORDER — SODIUM CHLORIDE 0.9 % IV SOLN: INTRAVENOUS | Status: DC

## 2023-08-09 MED ORDER — PROPOFOL 500 MG/50ML IV EMUL
INTRAVENOUS | Status: DC | PRN
  Administered 2023-08-09: 150 ug/kg/min via INTRAVENOUS

## 2023-08-09 MED ORDER — LIDOCAINE HCL (CARDIAC) PF 100 MG/5ML IV SOSY
PREFILLED_SYRINGE | INTRAVENOUS | Status: DC | PRN
  Administered 2023-08-09: 40 mg via INTRAVENOUS

## 2023-08-09 NOTE — Op Note (Signed)
[facility] Gastroenterology Patient Name: Regina Moreno Procedure Date: 08/09/2023 10:03 AM MRN: [id number] Account #: [id number] Date of Birth: [birth year] Admit Type: Outpatient Age: 67 Room: [facility] ENDO ROOM 1 Gender: Female Note Status: Finalized Instrument Name: Prentice Docker [id number] Procedure:             Colonoscopy Indications:           Screening for colorectal malignant neoplasm Providers:             Wyline Mood MD, MD Referring MD:          Duane Lope. Judithann Sheen, MD (Referring MD) Medicines:             Monitored Anesthesia Care Complications:         No immediate complications. Procedure:             Pre-Anesthesia Assessment:                        - Prior to the procedure, a History and Physical was                         performed, and patient medications, allergies and                         sensitivities were reviewed. The patient's tolerance                         of previous anesthesia was reviewed.                        - The risks and benefits of the procedure and the                         sedation options and risks were discussed with the                         patient. All questions were answered and informed                         consent was obtained.                        - ASA Grade Assessment: II - A patient with mild                         systemic disease.                        After obtaining informed consent, the colonoscope was                         passed under direct vision. Throughout the procedure,                         the patient's blood pressure, pulse, and oxygen                         saturations were monitored continuously. The                         Colonoscope was  introduced through the anus and                         advanced to the the cecum, identified by the                         appendiceal orifice. The colonoscopy was performed                         with ease. The patient tolerated the procedure  well.                         The quality of the bowel preparation was excellent.                         The ileocecal valve, appendiceal orifice, and rectum                         were photographed. Findings:      The perianal and digital rectal examinations were normal.      A 5 mm polyp was found in the sigmoid colon. The polyp was sessile. The       polyp was removed with a cold snare. Resection and retrieval were       complete.      The exam was otherwise without abnormality on direct and retroflexion       views. Impression:            - One 5 mm polyp in the sigmoid colon, removed with a                         cold snare. Resected and retrieved.                        - The examination was otherwise normal on direct and                         retroflexion views. Recommendation:        - Discharge patient to home (with escort).                        - Resume previous diet.                        - Continue present medications.                        - Await pathology results.                        - Repeat colonoscopy for surveillance based on                         pathology results. Procedure Code(s):     --- Professional ---                        [phone], Colonoscopy, flexible; with removal of                         tumor(s), polyp(s), or other lesion(s) by  snare                         technique Diagnosis Code(s):     --- Professional ---                        Z12.11, Encounter for screening for malignant neoplasm                         of colon                        D12.5, Benign neoplasm of sigmoid colon CPT copyright 2022 American Medical Association. All rights reserved. The codes documented in this report are preliminary and upon coder review may  be revised to meet current compliance requirements. Wyline Mood, MD Wyline Mood MD, MD 08/09/2023 10:47:39 AM This report has been signed electronically. Number of Addenda: 0 Note Initiated On: 08/09/2023 10:03  AM Scope Withdrawal Time: 0 hours 11 minutes 50 seconds  Total Procedure Duration: 0 hours 15 minutes 6 seconds  Estimated Blood Loss:  Estimated blood loss: none.      [facility]

## 2023-08-09 NOTE — Anesthesia Procedure Notes (Signed)
Date/Time: 08/09/2023 10:30 AM  Performed by: Stormy Fabian, CRNAPre-anesthesia Checklist: Patient identified, Emergency Drugs available, Suction available and Patient being monitored Patient Re-evaluated:Patient Re-evaluated prior to induction Oxygen Delivery Method: Nasal cannula Induction Type: IV induction Dental Injury: Teeth and Oropharynx as per pre-operative assessment  Comments: Nasal cannula with etCO2 monitoring

## 2023-08-09 NOTE — Anesthesia Preprocedure Evaluation (Signed)
Anesthesia Evaluation  Patient identified by MRN, date of birth, ID band Patient awake    Reviewed: Allergy & Precautions, H&P , NPO status , Patient's Chart, lab work & pertinent test results, reviewed documented beta blocker date and time   Airway Mallampati: II   Neck ROM: full    Dental  (+) Poor Dentition   Pulmonary neg pulmonary ROS, former smoker   Pulmonary exam normal        Cardiovascular Exercise Tolerance: Good hypertension, On Medications negative cardio ROS Normal cardiovascular exam Rhythm:regular Rate:Normal     Neuro/Psych negative neurological ROS  negative psych ROS   GI/Hepatic negative GI ROS, Neg liver ROS,,,  Endo/Other  negative endocrine ROSdiabetes, Well Controlled    Renal/GU Renal disease  negative genitourinary   Musculoskeletal   Abdominal   Peds  Hematology negative hematology ROS (+)   Anesthesia Other Findings Past Medical History: No date: Diabetes mellitus without complication (HCC) No date: Hypertension Past Surgical History: No date: ABDOMINAL HYSTERECTOMY BMI    Body Mass Index: 25.02 kg/m     Reproductive/Obstetrics negative OB ROS                             Anesthesia Physical Anesthesia Plan  ASA: 3  Anesthesia Plan: General   Post-op Pain Management:    Induction:   PONV Risk Score and Plan:   Airway Management Planned:   Additional Equipment:   Intra-op Plan:   Post-operative Plan:   Informed Consent: I have reviewed the patients History and Physical, chart, labs and discussed the procedure including the risks, benefits and alternatives for the proposed anesthesia with the patient or authorized representative who has indicated his/her understanding and acceptance.     Dental Advisory Given  Plan Discussed with: CRNA  Anesthesia Plan Comments:        Anesthesia Quick Evaluation

## 2023-08-09 NOTE — Transfer of Care (Signed)
Immediate Anesthesia Transfer of Care Note  Patient: Regina Moreno  Procedure(s) Performed: Procedure(s): COLONOSCOPY WITH PROPOFOL (N/A)  Patient Location: PACU and Endoscopy Unit  Anesthesia Type:General  Level of Consciousness: sedated  Airway & Oxygen Therapy: Patient Spontanous Breathing and Patient connected to nasal cannula oxygen  Post-op Assessment: Report given to RN and Post -op Vital signs reviewed and stable  Post vital signs: Reviewed and stable  Last Vitals:  Vitals:   08/09/23 1009 08/09/23 1048  BP: (!) 174/68 112/61  Pulse: 65 70  Resp:  15  Temp: (!) 36.2 C (!) 35.7 C  SpO2: 98% 97%    Complications: No apparent anesthesia complications

## 2023-08-10 ENCOUNTER — Encounter: Payer: Self-pay | Admitting: Gastroenterology

## 2023-08-10 LAB — SURGICAL PATHOLOGY

## 2023-08-10 NOTE — Anesthesia Postprocedure Evaluation (Signed)
Anesthesia Post Note  Patient: Regina Moreno  Procedure(s) Performed: COLONOSCOPY WITH PROPOFOL  Patient location during evaluation: PACU Anesthesia Type: General Level of consciousness: awake and alert Pain management: pain level controlled Vital Signs Assessment: post-procedure vital signs reviewed and stable Respiratory status: spontaneous breathing, nonlabored ventilation, respiratory function stable and patient connected to nasal cannula oxygen Cardiovascular status: blood pressure returned to baseline and stable Postop Assessment: no apparent nausea or vomiting Anesthetic complications: no   No notable events documented.   Last Vitals:  Vitals:   08/09/23 1058 08/09/23 1108  BP: 114/65 130/73  Pulse: 66 64  Resp: 19 (!) 9  Temp:    SpO2: 100% 100%    Last Pain:  Vitals:   08/10/23 0738  TempSrc:   PainSc: 0-No pain                 Yevette Edwards

## 2023-08-13 NOTE — H&P (Signed)
Wyline Mood, MD [address], [address], [place], [place], [address] [address], [address], [place], [place], [address] Phone: [phone]  Fax: [phone]  Primary Care Physician:  Marguarite Arbour, MD   Pre-Procedure History & Physical: HPI:  Regina Moreno is a 67 y.o. female is here for an colonoscopy.   Past Medical History:  Diagnosis Date   Diabetes mellitus without complication (HCC)    Hypertension     Past Surgical History:  Procedure Laterality Date   ABDOMINAL HYSTERECTOMY     COLONOSCOPY WITH PROPOFOL N/A 08/09/2023   Procedure: COLONOSCOPY WITH PROPOFOL;  Surgeon: Wyline Mood, MD;  Location: [facility] ENDOSCOPY;  Service: Gastroenterology;  Laterality: N/A;    Prior to Admission medications   Medication Sig Start Date End Date Taking? Authorizing Provider  amLODipine (NORVASC) 2.5 MG tablet Take 2.5 mg by mouth daily.   Yes [provider]  aspirin 81 MG tablet Take 4 tablets (325 mg total) by mouth daily. 09/10/18  Yes Shaune Pollack, MD  atorvastatin (LIPITOR) 40 MG tablet Take 1 tablet (40 mg total) by mouth daily at 6 PM. 09/09/18  Yes Shaune Pollack, MD  carvedilol (COREG) 6.25 MG tablet Take 6.25 mg by mouth 2 (two) times daily.   Yes [provider]  glipiZIDE-metformin (METAGLIP) 2.5-500 MG per tablet Take 2 tablets by mouth 2 (two) times daily.   Yes [provider]  JARDIANCE 25 MG TABS tablet Take 25 mg by mouth daily. 08/19/18  Yes [provider]  metFORMIN (GLUCOPHAGE) 1000 MG tablet Take 1,000 mg by mouth 2 (two) times daily.   Yes [provider]  olmesartan-hydrochlorothiazide (BENICAR HCT) 40-12.5 MG tablet Take 1 tablet by mouth daily. 08/12/18  Yes [provider]  omega-3 acid ethyl esters (LOVAZA) 1 g capsule Take 1 capsule (1 g total) by mouth 2 (two) times daily. 09/09/18  Yes Shaune Pollack, MD  omeprazole (PRILOSEC) 20 MG capsule Take 1 capsule by mouth daily.   Yes [provider]   terbinafine (LAMISIL) 250 MG tablet Take 1 tablet (250 mg total) by mouth daily. 07/28/23  Yes Hyatt, Max T, DPM  vitamin C (ASCORBIC ACID) 500 MG tablet Take 1,000 mg by mouth daily.   Yes [provider]  albuterol (PROVENTIL HFA;VENTOLIN HFA) 108 (90 Base) MCG/ACT inhaler Inhale 2 puffs into the lungs every 6 (six) hours as needed for wheezing. 03/11/18 03/11/19  [provider]  B-D ULTRAFINE III SHORT PEN 31G X 8 MM MISC See admin instructions.    [provider]  clopidogrel (PLAVIX) 75 MG tablet Take 1 tablet (75 mg total) by mouth daily. 09/09/18   Shaune Pollack, MD    Allergies as of 06/25/2023 - Review Complete 03/10/2023  Allergen Reaction Noted   Ampicillin Nausea Only 03/14/2018   Fenofibrate Nausea Only 04/13/2014   Simvastatin Nausea Only 03/14/2018   Sulfa antibiotics Hives 05/20/2015    Family History  Problem Relation Age of Onset   CAD Mother    Diabetes Father    Breast cancer Neg Hx     Social History   Socioeconomic History   Marital status: Married    Spouse name: Not on file   Number of children: Not on file   Years of education: Not on file   Highest education level: Not on file  Occupational History   Not on file  Tobacco Use   Smoking status: Former    Types: Cigarettes   Smokeless tobacco:  Never  Vaping Use   Vaping status: Never Used  Substance and Sexual Activity   Alcohol use: No   Drug use: No   Sexual activity: Yes  Other Topics Concern   Not on file  Social History Narrative   Not on file   Social Determinants of Health   Financial Resource Strain: Low Risk  (07/13/2023)   Received from [facility]   Overall Financial Resource Strain (CARDIA)    Difficulty of Paying Living Expenses: Not hard at all  Food Insecurity: No Food Insecurity (07/13/2023)   Received from [facility]   Hunger Vital Sign    Worried About Running Out of Food in the Last Year: Never true    Ran Out of  Food in the Last Year: Never true  Transportation Needs: No Transportation Needs (07/13/2023)   Received from [facility] - Transportation    In the past 12 months, has lack of transportation kept you from medical appointments or from getting medications?: No    Lack of Transportation (Non-Medical): No  Physical Activity: Not on file  Stress: Not on file  Social Connections: Not on file  Intimate Partner Violence: Not on file    Review of Systems: See HPI, otherwise negative ROS  Physical Exam: BP 130/73   Pulse 64   Temp (!) 96.3 F (35.7 C) (Temporal)   Resp (!) 9   Ht 5\' 6"  (1.676 m)   Wt 70.3 kg   SpO2 100%   BMI 25.02 kg/m  General:   Alert,  pleasant and cooperative in NAD Head:  Normocephalic and atraumatic. Neck:  Supple; no masses or thyromegaly. Lungs:  Clear throughout to auscultation, normal respiratory effort.    Heart:  +S1, +S2, Regular rate and rhythm, No edema. Abdomen:  Soft, nontender and nondistended. Normal bowel sounds, without guarding, and without rebound.   Neurologic:  Alert and  oriented x4;  grossly normal neurologically.  Impression/Plan: Regina Moreno is here for an colonoscopy to be performed for Screening colonoscopy average risk   Risks, benefits, limitations, and alternatives regarding  colonoscopy have been reviewed with the patient.  Questions have been answered.  All parties agreeable.   Wyline Mood, MD  08/13/2023, 9:57 AM

## 2023-08-17 ENCOUNTER — Telehealth: Payer: Self-pay

## 2023-08-17 NOTE — Telephone Encounter (Signed)
The patient stated she is having complication in hand where the Anesthesia. She said her hand really hurts and it is hard to hold a glass. She said the pain in her is going to her fingers and the front and back of her hand. She needs to speak with the nurse or doctor.

## 2023-08-17 NOTE — Telephone Encounter (Signed)
Called patient back and she stated that ever since she was getting her IV placed, the nurses at the endoscopy unit had to place it in 2 different locations but that they were hurting the site. However, they were able to get an IV in and through the whole process, it was hurting her. Patient stated that she had put ice packs, heat and nothing has helped her. I then told her that I had mentioned her symptoms to Dr. Tobi Bastos prior to calling her as with the previous message and he stated that since we do not place the IV or give her the anesthesia, that she would have to either go to an urgent care of her PCP to get evaluated. Patient stated that she was thinking of doing that but wanted to let us know first. Patient stated that she will call her PCP today to let him know (Regina Moreno). Patient had no further questions.

## 2023-08-25 ENCOUNTER — Encounter: Payer: Self-pay | Admitting: Gastroenterology

## 2023-08-30 ENCOUNTER — Ambulatory Visit (INDEPENDENT_AMBULATORY_CARE_PROVIDER_SITE_OTHER): Payer: Medicare HMO | Admitting: Vascular Surgery

## 2023-08-30 ENCOUNTER — Encounter (INDEPENDENT_AMBULATORY_CARE_PROVIDER_SITE_OTHER): Payer: Self-pay | Admitting: Vascular Surgery

## 2023-08-30 VITALS — BP 146/75 | HR 86 | Resp 16 | Wt 157.0 lb

## 2023-08-30 DIAGNOSIS — E1169 Type 2 diabetes mellitus with other specified complication: Secondary | ICD-10-CM

## 2023-08-30 DIAGNOSIS — E785 Hyperlipidemia, unspecified: Secondary | ICD-10-CM

## 2023-08-30 DIAGNOSIS — I6523 Occlusion and stenosis of bilateral carotid arteries: Secondary | ICD-10-CM | POA: Diagnosis not present

## 2023-08-30 DIAGNOSIS — I10 Essential (primary) hypertension: Secondary | ICD-10-CM | POA: Diagnosis not present

## 2023-08-30 DIAGNOSIS — E1122 Type 2 diabetes mellitus with diabetic chronic kidney disease: Secondary | ICD-10-CM | POA: Diagnosis not present

## 2023-08-30 DIAGNOSIS — N183 Chronic kidney disease, stage 3 unspecified: Secondary | ICD-10-CM

## 2023-08-30 DIAGNOSIS — K219 Gastro-esophageal reflux disease without esophagitis: Secondary | ICD-10-CM

## 2023-08-30 NOTE — Progress Notes (Signed)
MRN : [id number]  Regina Moreno is a 67 y.o. (1956/08/11) female who presents with chief complaint of check carotid arteries.  History of Present Illness:   The patient is seen for evaluation of carotid stenosis. The carotid stenosis was identified after Dr. Judithann Sheen ordered a follow-up duplex ultrasound, exam date July 28, 2023.  This study is reviewed by me and shows a 60 to 79% stenosis of the right internal carotid artery and a 40 to 59% stenosis of the left internal carotid artery.  Vertebral arteries are patent with antegrade flow and low resistance signals bilaterally.  Approximately 5 years ago the patient experienced onset of right facial numbness as well as numbness and dyskinesias of the right arm and right torso as well as more mild but similar symptoms of the right leg.  Was not associated with any weakness.  It appeared to be abrupt in onset.  At that time she did undergo CT angiography of the neck and head as well as MRA scans and an MR of the cervical spine.  There is no prior documented CVA was noted at the time of the studies however her symptoms have persisted since 2019.  I have reviewed with the patient that CT angiogram of the neck and head is demonstrated less than 50% stenosis of the internal carotid arteries bilaterally with normal vertebral arteries.  Intracranially a very focal Pica stenosis was identified on the left however there there appeared to be excellent collateral flow.  With respect to the MRI of the C-spine mild to moderate foraminal narrowing on the right at the C5-C6 level was noted otherwise the scan was unremarkable.  The patient denies amaurosis fugax. There is no recent history of TIA symptoms or focal motor deficits.  There is no history of migraine headaches. There is no history of seizures.  The patient is taking enteric-coated aspirin 81 mg daily.  No recent shortening of the patient's walking distance or new  symptoms consistent with claudication.  No history of rest pain symptoms. No new ulcers or wounds of the lower extremities have occurred.  There is no history of DVT, PE or superficial thrombophlebitis. No recent episodes of angina or shortness of breath documented.   Current Meds  Medication Sig   aspirin 81 MG tablet Take 4 tablets (325 mg total) by mouth daily.   atorvastatin (LIPITOR) 40 MG tablet Take 1 tablet (40 mg total) by mouth daily at 6 PM.   B-D ULTRAFINE III SHORT PEN 31G X 8 MM MISC See admin instructions.   carvedilol (COREG) 6.25 MG tablet Take 6.25 mg by mouth 2 (two) times daily.   clopidogrel (PLAVIX) 75 MG tablet Take 1 tablet (75 mg total) by mouth daily.   JARDIANCE 25 MG TABS tablet Take 25 mg by mouth daily.   metFORMIN (GLUCOPHAGE) 1000 MG tablet Take 1,000 mg by mouth 2 (two) times daily.   omega-3 acid ethyl esters (LOVAZA) 1 g capsule Take 1 capsule (1 g total) by mouth 2 (two) times daily.   omeprazole (PRILOSEC) 20 MG capsule Take 1 capsule by mouth as needed.   OZEMPIC, 1 MG/DOSE, 4 MG/3ML SOPN Inject into the skin once a week.   vitamin C (ASCORBIC ACID) 500 MG tablet Take 500 mg by mouth daily.    Past Medical History:  Diagnosis Date   Diabetes mellitus without  complication (HCC)    Hypertension     Past Surgical History:  Procedure Laterality Date   ABDOMINAL HYSTERECTOMY     COLONOSCOPY WITH PROPOFOL N/A 08/09/2023   Procedure: COLONOSCOPY WITH PROPOFOL;  Surgeon: Wyline Mood, MD;  Location: [facility] ENDOSCOPY;  Service: Gastroenterology;  Laterality: N/A;    Social History Social History   Tobacco Use   Smoking status: Former    Types: Cigarettes   Smokeless tobacco: Never  Vaping Use   Vaping status: Never Used  Substance Use Topics   Alcohol use: No   Drug use: No    Family History Family History  Problem Relation Age of Onset   CAD Mother    Diabetes Father    Breast cancer Neg Hx     Allergies  Allergen Reactions    Ampicillin Nausea Only    Can take Augmentin   Fenofibrate Nausea Only   Simvastatin Nausea Only   Sulfa Antibiotics Hives    Other reaction(s): Unknown   Tramadol Nausea Only    Makes her very dizzy and nauseated     REVIEW OF SYSTEMS (Negative unless checked)  Constitutional: [] Weight loss  [] Fever  [] Chills Cardiac: [] Chest pain   [] Chest pressure   [] Palpitations   [] Shortness of breath when laying flat   [] Shortness of breath with exertion. Vascular:  [x] Pain in legs with walking   [] Pain in legs at rest  [] History of DVT   [] Phlebitis   [] Swelling in legs   [] Varicose veins   [] Non-healing ulcers Pulmonary:   [] Uses home oxygen   [] Productive cough   [] Hemoptysis   [] Wheeze  [] COPD   [x] Asthma Neurologic:  [] Dizziness   [] Seizures   [] History of stroke   [] History of TIA  [] Aphasia   [] Vissual changes   [] Weakness or numbness in arm   [] Weakness or numbness in leg Musculoskeletal:   [] Joint swelling   [] Joint pain   [] Low back pain Hematologic:  [] Easy bruising  [] Easy bleeding   [] Hypercoagulable state   [] Anemic Gastrointestinal:  [] Diarrhea   [] Vomiting  [x] Gastroesophageal reflux/heartburn   [] Difficulty swallowing. Genitourinary:  [] Chronic kidney disease   [] Difficult urination  [] Frequent urination   [] Blood in urine Skin:  [] Rashes   [] Ulcers  Psychological:  [] History of anxiety   []  History of major depression.  Physical Examination  Vitals:   08/30/23 0920  BP: (!) 146/75  Pulse: 86  Resp: 16  Weight: 157 lb (71.2 kg)   Body mass index is 25.34 kg/m. Gen: WD/WN, NAD Head: [place]/AT, No temporalis wasting.  Ear/Nose/Throat: Hearing grossly intact, nares w/o erythema or drainage Eyes: PER, EOMI, sclera nonicteric.  Neck: Supple, no masses.  No bruit or JVD.  Pulmonary:  Good air movement, no audible wheezing, no use of accessory muscles.  Cardiac: RRR, normal S1, S2, no Murmurs. Vascular:  carotid bruit noted Vessel Right Left  Radial Palpable Palpable   Carotid  Palpable  Palpable  Gastrointestinal: soft, non-distended. No guarding/no peritoneal signs.  Musculoskeletal: M/S 5/5 throughout.  No visible deformity.  Neurologic: CN 2-12 intact. Pain and light touch intact in extremities.  Symmetrical.  Speech is fluent. Motor exam as listed above. Psychiatric: Judgment intact, Mood & affect appropriate for pt's clinical situation. Dermatologic: No rashes or ulcers noted.  No changes consistent with cellulitis.   CBC Lab Results  Component Value Date   WBC 9.1 09/08/2018   HGB 14.1 09/08/2018   HCT 42.8 09/08/2018   MCV 92.2 09/08/2018   PLT 301 09/08/2018  BMET    Component Value Date/Time   NA 140 09/08/2018 1615   NA 136 09/12/2013 0739   K 3.7 09/08/2018 1615   K 3.0 (L) 09/12/2013 0739   CL 98 09/08/2018 1615   CL 102 09/12/2013 0739   CO2 26 09/08/2018 1615   CO2 26 09/12/2013 0739   GLUCOSE 208 (H) 09/08/2018 1615   GLUCOSE 175 (H) 09/12/2013 0739   BUN 23 09/08/2018 1615   BUN 18 09/12/2013 0739   CREATININE 1.04 (H) 09/08/2018 1615   CREATININE 1.10 09/12/2013 0739   CALCIUM 9.2 09/08/2018 1615   CALCIUM 9.0 09/12/2013 0739   GFRNONAA 56 (L) 09/08/2018 1615   GFRNONAA 56 (L) 09/12/2013 0739   GFRAA >60 09/08/2018 1615   GFRAA >60 09/12/2013 0739   CrCl cannot be calculated (Patient's most recent lab result is older than the maximum 21 days allowed.).  COAG Lab Results  Component Value Date   INR 0.84 09/08/2018    Radiology No results found.   Assessment/Plan 1. Bilateral carotid artery stenosis Recommend:  Given the patient's asymptomatic subcritical stenosis no further invasive testing or surgery at this time.  Duplex ultrasound shows RICA 60-79% and LICA 40-59% stenosis bilaterally.  Continue antiplatelet therapy as prescribed Continue management of CAD, HTN and Hyperlipidemia Healthy heart diet,  encouraged exercise at least 4 times per week.  With respect to her persistent neurological  symptoms, she is to see Dr. Sherryll Burger in a few weeks.  Follow up in 6 months with duplex ultrasound and physical exam.  - VAS US CAROTID; Future  2. Benign essential hypertension Continue antihypertensive medications as already ordered, these medications have been reviewed and there are no changes at this time.  3. Hyperlipidemia associated with type 2 diabetes mellitus (HCC) Continue statin as ordered and reviewed, no changes at this time  4. Type 2 diabetes mellitus with stage 3 chronic kidney disease, unspecified whether long term insulin use, unspecified whether stage 3a or 3b CKD (HCC) Continue hypoglycemic medications as already ordered, these medications have been reviewed and there are no changes at this time.  Hgb A1C to be monitored as already arranged by primary service  5. Gastroesophageal reflux disease, unspecified whether esophagitis present Continue PPI as already ordered, this medication has been reviewed and there are no changes at this time.  Avoidence of caffeine and alcohol  Moderate elevation of the head of the bed     Levora Dredge, MD  08/30/2023 9:39 AM

## 2023-08-31 ENCOUNTER — Telehealth: Payer: Self-pay

## 2023-08-31 DIAGNOSIS — R197 Diarrhea, unspecified: Secondary | ICD-10-CM

## 2023-08-31 NOTE — Telephone Encounter (Signed)
Pt called today stating she had her colonoscopy on 08/09/23. Pt stated the next day, she started having nausea. Over the last couple of weeks, she started getting worse. She started having diarrhea, nausea, vomiting and just weakness. She saw her PCP on 08/23/23 and was noted positive for C-diff. Pt would like to discuss with you. Pt stated she would like reimbursement for the $40 that she had to pay for her antibiotic. Please contact pt to discuss.

## 2023-09-01 ENCOUNTER — Encounter: Payer: Self-pay | Admitting: Podiatry

## 2023-09-01 ENCOUNTER — Ambulatory Visit: Payer: Medicare HMO | Admitting: Podiatry

## 2023-09-01 DIAGNOSIS — L603 Nail dystrophy: Secondary | ICD-10-CM | POA: Diagnosis not present

## 2023-09-01 DIAGNOSIS — Z79899 Other long term (current) drug therapy: Secondary | ICD-10-CM | POA: Diagnosis not present

## 2023-09-01 MED ORDER — TERBINAFINE HCL 250 MG PO TABS: 250.0000 mg | ORAL_TABLET | Freq: Every day | ORAL | 0 refills | Status: DC

## 2023-09-01 NOTE — Progress Notes (Signed)
Regina Moreno presents today for follow-up of her nail fungus.  She is completed the first 30 days of Lamisil has recently had a comprehensive metabolic panel.  States that she is doing quite well at this point.  She denies fever chills nausea mobic muscle aches pains calf pain back pain chest pain shortness of breath.  States that the nails are looking better.  Objective: No change in physical exam.  Assessment: Long-term therapy with Lamisil for onychomycosis.  Plan: Dispensed a 90-day dose of Lamisil 250 mg tablets.  She will take 1 tablet by mouth daily.  Follow-up with her in 4 months questions or concerns will be directed to the office.

## 2023-09-16 NOTE — Addendum Note (Signed)
Addended by: Adela Ports on: 09/16/2023 01:41 PM   Modules accepted: Orders

## 2023-09-16 NOTE — Telephone Encounter (Addendum)
Patient called stating that she had her colonoscopy done on 08/09/2023 and that she contracted C-Diff. She stated that she had taken all of her antibiotics and continues to have the same symptoms. Patient had seen her PCP-Dr. Judithann Sheen and she mentioned to him that the C-Diff should be leaving soon. However, she stated that we are already at mid November and doesn't understand why she is not doing any better. Patient stated that she had spoken to Dr. Tobi Bastos since but she wanted to let him know that her diarrhea, abdominal cramping continues and wanted to know what she could do. I let her know that I would be calling her back once I spoke to Dr. Tobi Bastos. I was able to communicate this message with Dr. Tobi Bastos since he came to my desk at this moment and I let him know about her symptoms and he recommended for her to get stool samples and to stop any artificial sweeteners, NSAIDS, and any type of laxatives if she is taking any. He also stated that Ozempic, Metformin and Glipizide cause diarrhea as well. I called the patient back and let her know what he recommended and she stated that she would follow what was recommended before and now (taking Align and yogurt) and what he told her today. Patient stated that she would come to the office right now to collect the stool supplies. I let her know that the lab tech was here until 4:30 pm. Lab order given to the lab tech.

## 2023-09-21 ENCOUNTER — Telehealth: Payer: Self-pay

## 2023-09-21 LAB — C DIFFICILE TOXINS A+B W/RFLX: C difficile Toxins A+B, EIA: NEGATIVE

## 2023-09-21 LAB — C DIFFICILE, CYTOTOXIN B

## 2023-09-21 NOTE — Telephone Encounter (Signed)
Patient is calling wanting to know the results of stool test. She states she return it to the [facility] at [facility] on Friday.

## 2023-09-21 NOTE — Telephone Encounter (Signed)
Patient stated that she is feeling better and that she is gradually having more formed stools. She stated that she will continue to take the probiotics and yogurt until completely doing well.

## 2023-09-22 LAB — GI PROFILE, STOOL, PCR

## 2023-09-22 LAB — CALPROTECTIN, FECAL: Calprotectin, Fecal: 6 ug/g (ref 0–120)

## 2023-09-24 ENCOUNTER — Telehealth: Payer: Self-pay

## 2023-09-24 NOTE — Telephone Encounter (Signed)
Called patient to let her know the below information and she was glad to hear. She stated that she was already starting to feel better. Patient had no further questions.

## 2023-09-24 NOTE — Telephone Encounter (Signed)
-----   Message from Wyline Mood sent at 09/23/2023 12:49 PM EST ----- Inform GI PCR negative - should start feeliong better gradually- can take a few weeks after a GI bug to return to normal .

## 2023-11-24 NOTE — Progress Notes (Signed)
[facility] - Clinic Note  12/06/2023     CHIEF COMPLAINT Patient presents for Retina Follow Up   HISTORY OF PRESENT ILLNESS: Regina Moreno is a 68 y.o. female who presents to the clinic today for:   HPI     Retina Follow Up   Patient presents with  Diabetic Retinopathy.  In both eyes.  This started 9 months ago.  I, the attending physician,  performed the HPI with the patient and updated documentation appropriately.        Comments   Patient here for 9 months retina follow up for NPDR OU. Patient states vision doing pretty good. No eye pain.       Last edited by Rennis Chris, MD on 12/06/2023 12:54 PM.    Pt states her cholesterol medication has been increased within the last month, she is on jardiance, metformin, basaglar and ozempic   Referring physician: Isla Pence, OD [address] [place],  [place] [address]  HISTORICAL INFORMATION:   Selected notes from the MEDICAL RECORD NUMBER Referred by Dr. Janee Morn for retinal hemorrhages OU, diabetic eval LEE:  Ocular Hx- CEIOL OU 2023, [facility]-    CURRENT MEDICATIONS: No current outpatient medications on file. (Ophthalmic Drugs)   No current facility-administered medications for this visit. (Ophthalmic Drugs)   Current Outpatient Medications (Other)  Medication Sig   aspirin 81 MG tablet Take 4 tablets (325 mg total) by mouth daily.   atorvastatin (LIPITOR) 40 MG tablet Take 1 tablet (40 mg total) by mouth daily at 6 PM. (Patient taking differently: Take 80 mg by mouth daily at 6 PM.)   B-D ULTRAFINE III SHORT PEN 31G X 8 MM MISC See admin instructions.   carvedilol (COREG) 6.25 MG tablet Take 6.25 mg by mouth 2 (two) times daily.   clopidogrel (PLAVIX) 75 MG tablet Take 1 tablet (75 mg total) by mouth daily.   JARDIANCE 25 MG TABS tablet Take 25 mg by mouth daily.   metFORMIN (GLUCOPHAGE) 1000 MG tablet Take 1,000 mg by mouth 2 (two) times daily.   omeprazole (PRILOSEC) 20 MG capsule  Take 1 capsule by mouth as needed.   OZEMPIC, 1 MG/DOSE, 4 MG/3ML SOPN Inject into the skin once a week.   albuterol (PROVENTIL HFA;VENTOLIN HFA) 108 (90 Base) MCG/ACT inhaler Inhale 2 puffs into the lungs every 6 (six) hours as needed for wheezing.   amLODipine (NORVASC) 2.5 MG tablet Take 2.5 mg by mouth daily. (Patient not taking: Reported on 12/06/2023)   glipiZIDE-metformin (METAGLIP) 2.5-500 MG per tablet Take 2 tablets by mouth 2 (two) times daily. (Patient not taking: Reported on 08/30/2023)   olmesartan-hydrochlorothiazide (BENICAR HCT) 40-12.5 MG tablet Take 1 tablet by mouth daily. (Patient not taking: Reported on 08/30/2023)   omega-3 acid ethyl esters (LOVAZA) 1 g capsule Take 1 capsule (1 g total) by mouth 2 (two) times daily. (Patient not taking: Reported on 12/06/2023)   terbinafine (LAMISIL) 250 MG tablet Take 1 tablet (250 mg total) by mouth daily. (Patient not taking: Reported on 12/06/2023)   vitamin C (ASCORBIC ACID) 500 MG tablet Take 500 mg by mouth daily. (Patient not taking: Reported on 12/06/2023)   No current facility-administered medications for this visit. (Other)   REVIEW OF SYSTEMS: ROS   Positive for: Gastrointestinal, Endocrine, Eyes, Respiratory Negative for: Constitutional, Neurological, Skin, Genitourinary, Musculoskeletal, HENT, Cardiovascular, Psychiatric, Allergic/Imm, Heme/Lymph Last edited by Laddie Aquas, COA on 12/06/2023  9:39 AM.  ALLERGIES Allergies  Allergen Reactions   Ampicillin Nausea Only    Can take Augmentin   Fenofibrate Nausea Only   Simvastatin Nausea Only   Sulfa Antibiotics Hives    Other reaction(s): Unknown   Tramadol Nausea Only    Makes her very dizzy and nauseated    PAST MEDICAL HISTORY Past Medical History:  Diagnosis Date   Diabetes mellitus without complication (HCC)    Hypertension    Past Surgical History:  Procedure Laterality Date   ABDOMINAL HYSTERECTOMY     COLONOSCOPY WITH PROPOFOL N/A 08/09/2023    Procedure: COLONOSCOPY WITH PROPOFOL;  Surgeon: Wyline Mood, MD;  Location: [facility] ENDOSCOPY;  Service: Gastroenterology;  Laterality: N/A;   FAMILY HISTORY Family History  Problem Relation Age of Onset   CAD Mother    Diabetes Father    Breast cancer Neg Hx    SOCIAL HISTORY Social History   Tobacco Use   Smoking status: Former    Types: Cigarettes   Smokeless tobacco: Never  Vaping Use   Vaping status: Never Used  Substance Use Topics   Alcohol use: No   Drug use: No       OPHTHALMIC EXAM:  Base Eye Exam     Visual Acuity (Snellen - Linear)       Right Left   Dist [place] 20/20 20/25 -2   Dist ph [place]  20/20         Tonometry (Tonopen, 9:37 AM)       Right Left   Pressure 11 10         Pupils       Dark Light Shape React APD   Right 3 2 Round Brisk None   Left 3 2 Round Brisk None         Visual Fields (Counting fingers)       Left Right    Full Full         Extraocular Movement       Right Left    Full, Ortho Full, Ortho         Neuro/Psych     Oriented x3: Yes   Mood/Affect: Normal         Dilation     Both eyes: 1.0% Mydriacyl, 2.5% Phenylephrine @ 9:37 AM           Slit Lamp and Fundus Exam     Slit Lamp Exam       Right Left   Lids/Lashes Dermatochalasis - upper lid, mild MGD Dermatochalasis - upper lid, mild MGD   Conjunctiva/Sclera White and quiet White and quiet   Cornea 2-3+ fine PEE, well healed cataract wound 2-3+ fine PEE, well healed cataract wound   Anterior Chamber deep and clear deep and clear   Iris Round and dilated, No NVI Round and dilated, No NVI   Lens PC IOL in good position PC IOL in good position   Anterior Vitreous syneresis Posterior vitreous detachment, Weiss ring, mild syneresis         Fundus Exam       Right Left   Disc trace Pallor, Sharp rim trace Pallor, Sharp rim   C/D Ratio 0.4 0.4   Macula Flat, Good foveal reflex, scattered MA/DBH, mild CWS ST Flat, Good foveal reflex, scattered  MA/DBH, focal flame heme nasal macula   Vessels attenuated, Tortuous, mild copper wiring, AV crossing changes attenuated, Tortuous, mild AV crossing changes   Periphery Attached, scattered MA/DBH greatest posteriorly Attached, scattered MA/DBH greatest posteriorly,  focal CWS SN to disc           Refraction     Wearing Rx       Sphere   Right OTC   Left OTC         Wearing Rx #2       Sphere   Right OTC   Left OTC           IMAGING AND PROCEDURES  Imaging and Procedures for 12/06/2023  OCT, Retina - OU - Both Eyes       Right Eye Quality was good. Central Foveal Thickness: 269. Progression has been stable. Findings include normal foveal contour, no IRF, no SRF, vitreomacular adhesion .   Left Eye Quality was good. Central Foveal Thickness: 270. Progression has been stable. Findings include normal foveal contour, no IRF, no SRF (Focal PED temporal macula).   Notes *Images captured and stored on drive  Diagnosis / Impression:  NFP; no IRF/SRF OU OS: small focal PED temporal macula   Clinical management:  See below  Abbreviations: NFP - Normal foveal profile. CME - cystoid macular edema. PED - pigment epithelial detachment. IRF - intraretinal fluid. SRF - subretinal fluid. EZ - ellipsoid zone. ERM - epiretinal membrane. ORA - outer retinal atrophy. ORT - outer retinal tubulation. SRHM - subretinal hyper-reflective material. IRHM - intraretinal hyper-reflective material            ASSESSMENT/PLAN:    ICD-10-CM   1. Moderate nonproliferative diabetic retinopathy of both eyes without macular edema associated with type 2 diabetes mellitus (HCC)  E11.3393 OCT, Retina - OU - Both Eyes    2. Long term (current) use of oral hypoglycemic drugs  Z79.84     3. Current use of insulin (HCC)  Z79.4     4. Long-term (current) use of injectable non-insulin antidiabetic drugs  Z79.85     5. Posterior vitreous detachment of left eye  H43.812     6. Essential  hypertension  I10     7. Hypertensive retinopathy of both eyes  H35.033     8. Pseudophakia, both eyes  Z96.1      1-4. Moderate nonproliferative diabetic retinopathy w/o DME, both eyes   - A1c: 6.7 on 12.11.24 - BCVA remains 20/20 OU - exam shows scattered MA/DBH OU -- mostly posteriorly  - OCT without diabetic macular edema, both eyes  - FA 03.25.24 shows scattered leaking MA greatest posteriorly OU; no NV OU - f/u in 12 mos -- DFE/OCT  5. PVD / vitreous syneresis OS  - subacute, symptomatic floaters, onset ~early March 2024 -- presented to Dr. Clydene Pugh on 03.13.24  - floaters improved today  - Discussed findings and prognosis  - +Weiss ring but no RT or RD on 360 peripheral exam  - Reviewed s/s of RT/RD  - Strict return precautions for any such RT/RD signs/symptoms  - monitor  6,7. Hypertensive retinopathy OU  - BP in office on 03.25.24 was 160s/80s - discussed importance of tight BP control and likely contribution to retinal hemorrhages - monitor  8. Pseudophakia OU  - s/p CE/IOL OU (Dr. Elmer Picker, OD 03.07.23, OS 02.07.23)  - IOL in good position, doing well  - monitor  Ophthalmic Meds Ordered this visit:  No orders of the defined types were placed in this encounter.    Return in about 1 year (around 12/05/2024) for f/u NPDR OU, DFE, OCT.  There are no Patient Instructions on file for this visit.   Explained the diagnoses,  plan, and follow up with the patient and they expressed understanding.  Patient expressed understanding of the importance of proper follow up care.   This document serves as a record of services personally performed by Karie Chimera, MD, PhD. It was created on their behalf by Glee Arvin. Manson Passey, OA an ophthalmic technician. The creation of this record is the provider's dictation and/or activities during the visit.    Electronically signed by: Glee Arvin. Manson Passey, OA 12/06/23 12:54 PM  Karie Chimera, M.D., Ph.D. Diseases & Surgery of the Retina and  Vitreous [facility]  I have reviewed the above documentation for accuracy and completeness, and I agree with the above. Karie Chimera, M.D., Ph.D. 12/06/23 12:56 PM   Abbreviations: M myopia (nearsighted); A astigmatism; H hyperopia (farsighted); P presbyopia; Mrx spectacle prescription;  CTL contact lenses; OD right eye; OS left eye; OU both eyes  XT exotropia; ET esotropia; PEK punctate epithelial keratitis; PEE punctate epithelial erosions; DES dry eye syndrome; MGD meibomian gland dysfunction; ATs artificial tears; PFAT's preservative free artificial tears; NSC nuclear sclerotic cataract; PSC posterior subcapsular cataract; ERM epi-retinal membrane; PVD posterior vitreous detachment; RD retinal detachment; DM diabetes mellitus; DR diabetic retinopathy; NPDR non-proliferative diabetic retinopathy; PDR proliferative diabetic retinopathy; CSME clinically significant macular edema; DME diabetic macular edema; dbh dot blot hemorrhages; CWS cotton wool spot; POAG primary open angle glaucoma; C/D cup-to-disc ratio; HVF humphrey visual field; GVF goldmann visual field; OCT optical coherence tomography; IOP intraocular pressure; BRVO Branch retinal vein occlusion; CRVO central retinal vein occlusion; CRAO central retinal artery occlusion; BRAO branch retinal artery occlusion; RT retinal tear; SB scleral buckle; PPV pars plana vitrectomy; VH Vitreous hemorrhage; PRP panretinal laser photocoagulation; IVK intravitreal kenalog; VMT vitreomacular traction; MH Macular hole;  NVD neovascularization of the disc; NVE neovascularization elsewhere; AREDS age related eye disease study; ARMD age related macular degeneration; POAG primary open angle glaucoma; EBMD epithelial/anterior basement membrane dystrophy; ACIOL anterior chamber intraocular lens; IOL intraocular lens; PCIOL posterior chamber intraocular lens; Phaco/IOL phacoemulsification with intraocular lens placement; PRK photorefractive  keratectomy; LASIK laser assisted in situ keratomileusis; HTN hypertension; DM diabetes mellitus; COPD chronic obstructive pulmonary disease

## 2023-12-06 ENCOUNTER — Encounter (INDEPENDENT_AMBULATORY_CARE_PROVIDER_SITE_OTHER): Payer: Self-pay | Admitting: Ophthalmology

## 2023-12-06 ENCOUNTER — Ambulatory Visit (INDEPENDENT_AMBULATORY_CARE_PROVIDER_SITE_OTHER): Payer: Medicare HMO | Admitting: Ophthalmology

## 2023-12-06 DIAGNOSIS — H43812 Vitreous degeneration, left eye: Secondary | ICD-10-CM

## 2023-12-06 DIAGNOSIS — Z961 Presence of intraocular lens: Secondary | ICD-10-CM

## 2023-12-06 DIAGNOSIS — H35033 Hypertensive retinopathy, bilateral: Secondary | ICD-10-CM

## 2023-12-06 DIAGNOSIS — Z7984 Long term (current) use of oral hypoglycemic drugs: Secondary | ICD-10-CM | POA: Diagnosis not present

## 2023-12-06 DIAGNOSIS — Z7985 Long-term (current) use of injectable non-insulin antidiabetic drugs: Secondary | ICD-10-CM

## 2023-12-06 DIAGNOSIS — I10 Essential (primary) hypertension: Secondary | ICD-10-CM

## 2023-12-06 DIAGNOSIS — E113393 Type 2 diabetes mellitus with moderate nonproliferative diabetic retinopathy without macular edema, bilateral: Secondary | ICD-10-CM

## 2023-12-06 DIAGNOSIS — Z794 Long term (current) use of insulin: Secondary | ICD-10-CM | POA: Diagnosis not present

## 2023-12-29 ENCOUNTER — Encounter: Payer: Self-pay | Admitting: Podiatry

## 2023-12-29 ENCOUNTER — Ambulatory Visit: Payer: Medicare HMO | Admitting: Podiatry

## 2023-12-29 DIAGNOSIS — L603 Nail dystrophy: Secondary | ICD-10-CM

## 2023-12-29 MED ORDER — TERBINAFINE HCL 250 MG PO TABS: 250.0000 mg | ORAL_TABLET | Freq: Every day | ORAL | 0 refills | Status: DC

## 2023-12-29 NOTE — Progress Notes (Signed)
 She presents today after having completed 120 days of therapy.  States her toenails are starting to grow out she is very happy with the outcome they are looking much better she says she has no side effects with the medication.  Objective: There is no erythema edema salines drainage odor nail plates appear to be growing out by approximately 25%.  Assessment: Well-healing onychomycosis long-term therapy with Lamisil.  Plan: Started her on another 30 tablets of Lamisil 1 tablet every other day and I will follow-up with her in 3 months dispense 30 tablets.

## 2024-01-13 ENCOUNTER — Other Ambulatory Visit: Payer: Self-pay | Admitting: Internal Medicine

## 2024-01-13 DIAGNOSIS — Z1231 Encounter for screening mammogram for malignant neoplasm of breast: Secondary | ICD-10-CM

## 2024-02-27 NOTE — Progress Notes (Signed)
 MRN : [id number]  Regina Moreno is a 67 y.o. (02-26-1956) female who presents with chief complaint of check carotid arteries.  History of Present Illness:   The patient is seen for evaluation of carotid stenosis. The carotid stenosis was identified after Dr. Claudius Cumins ordered a follow-up duplex ultrasound, exam date July 28, 2023.  This study is reviewed by me and shows a 60 to 79% stenosis of the right internal carotid artery and a 40 to 59% stenosis of the left internal carotid artery.  Vertebral arteries are patent with antegrade flow and low resistance signals bilaterally.   Approximately 5 years ago the patient experienced onset of right facial numbness as well as numbness and dyskinesias of the right arm and right torso as well as more mild but similar symptoms of the right leg.  Was not associated with any weakness.  It appeared to be abrupt in onset.  At that time she did undergo CT angiography of the neck and head as well as MRA scans and an MR of the cervical spine.  There is no prior documented CVA was noted at the time of the studies however her symptoms have persisted since 2019.   I have reviewed with the patient that CT angiogram of the neck and head is demonstrated less than 50% stenosis of the internal carotid arteries bilaterally with normal vertebral arteries.  Intracranially a very focal [place] stenosis was identified on the left however there there appeared to be excellent collateral flow.   With respect to the MRI of the C-spine mild to moderate foraminal narrowing on the right at the C5-C6 level was noted otherwise the scan was unremarkable.   The patient denies amaurosis fugax. There is no recent history of TIA symptoms or focal motor deficits.  There is no history of migraine headaches. There is no history of seizures.   The patient is taking enteric-coated aspirin  81 mg daily.   No recent shortening of the patient's walking distance or new  symptoms consistent with claudication.  No history of rest pain symptoms. No new ulcers or wounds of the lower extremities have occurred.   There is no history of DVT, PE or superficial thrombophlebitis. No recent episodes of angina or shortness of breath documented.   Duplex ultrasound shows RICA 40-59% and LICA 40-59% stenosis bilaterally.   No outpatient medications have been marked as taking for the 02/28/24 encounter (Appointment) with Prescilla Brod, Ninette Basque, MD.    Past Medical History:  Diagnosis Date   Diabetes mellitus without complication (HCC)    Hypertension     Past Surgical History:  Procedure Laterality Date   ABDOMINAL HYSTERECTOMY     COLONOSCOPY WITH PROPOFOL  N/A 08/09/2023   Procedure: COLONOSCOPY WITH PROPOFOL ;  Surgeon: Luke Salaam, MD;  Location: [facility] ENDOSCOPY;  Service: Gastroenterology;  Laterality: N/A;    Social History Social History   Tobacco Use   Smoking status: Former    Types: Cigarettes   Smokeless tobacco: Never  Vaping Use   Vaping status: Never Used  Substance Use Topics   Alcohol use: No   Drug use: No    Family History Family History  Problem Relation Age of Onset   CAD Mother    Diabetes Father    Breast cancer Neg Hx     Allergies  Allergen Reactions   Ampicillin Nausea Only  Can take Augmentin   Fenofibrate Nausea Only   Simvastatin Nausea Only   Sulfa Antibiotics Hives    Other reaction(s): Unknown   Tramadol Nausea Only    Makes her very dizzy and nauseated     REVIEW OF SYSTEMS (Negative unless checked)  Constitutional: [] Weight loss  [] Fever  [] Chills Cardiac: [] Chest pain   [] Chest pressure   [] Palpitations   [] Shortness of breath when laying flat   [] Shortness of breath with exertion. Vascular:  [x] Pain in legs with walking   [] Pain in legs at rest  [] History of DVT   [] Phlebitis   [] Swelling in legs   [] Varicose veins   [] Non-healing ulcers Pulmonary:   [] Uses home oxygen   [] Productive cough   [] Hemoptysis    [] Wheeze  [] COPD   [] Asthma Neurologic:  [] Dizziness   [] Seizures   [] History of stroke   [] History of TIA  [] Aphasia   [] Vissual changes   [] Weakness or numbness in arm   [] Weakness or numbness in leg Musculoskeletal:   [] Joint swelling   [] Joint pain   [] Low back pain Hematologic:  [] Easy bruising  [] Easy bleeding   [] Hypercoagulable state   [] Anemic Gastrointestinal:  [] Diarrhea   [] Vomiting  [x] Gastroesophageal reflux/heartburn   [] Difficulty swallowing. Genitourinary:  [x] Chronic kidney disease   [] Difficult urination  [] Frequent urination   [] Blood in urine Skin:  [] Rashes   [] Ulcers  Psychological:  [] History of anxiety   []  History of major depression.  Physical Examination  There were no vitals filed for this visit. There is no height or weight on file to calculate BMI. Gen: WD/WN, NAD Head: [place]/AT, No temporalis wasting.  Ear/Nose/Throat: Hearing grossly intact, nares w/o erythema or drainage Eyes: PER, EOMI, sclera nonicteric.  Neck: Supple, no masses.  No bruit or JVD.  Pulmonary:  Good air movement, no audible wheezing, no use of accessory muscles.  Cardiac: RRR, normal S1, S2, no Murmurs. Vascular:  carotid bruit noted Vessel Right Left  Radial Palpable Palpable  Carotid  Palpable  Palpable  Subclav  Palpable Palpable  Gastrointestinal: soft, non-distended. No guarding/no peritoneal signs.  Musculoskeletal: M/S 5/5 throughout.  No visible deformity.  Neurologic: CN 2-12 intact. Pain and light touch intact in extremities.  Symmetrical.  Speech is fluent. Motor exam as listed above. Psychiatric: Judgment intact, Mood & affect appropriate for pt's clinical situation. Dermatologic: No rashes or ulcers noted.  No changes consistent with cellulitis.   CBC Lab Results  Component Value Date   WBC 9.1 09/08/2018   HGB 14.1 09/08/2018   HCT 42.8 09/08/2018   MCV 92.2 09/08/2018   PLT 301 09/08/2018    BMET    Component Value Date/Time   NA 140 09/08/2018 1615   NA 136  09/12/2013 0739   K 3.7 09/08/2018 1615   K 3.0 (L) 09/12/2013 0739   CL 98 09/08/2018 1615   CL 102 09/12/2013 0739   CO2 26 09/08/2018 1615   CO2 26 09/12/2013 0739   GLUCOSE 208 (H) 09/08/2018 1615   GLUCOSE 175 (H) 09/12/2013 0739   BUN 23 09/08/2018 1615   BUN 18 09/12/2013 0739   CREATININE 1.04 (H) 09/08/2018 1615   CREATININE 1.10 09/12/2013 0739   CALCIUM  9.2 09/08/2018 1615   CALCIUM  9.0 09/12/2013 0739   GFRNONAA 56 (L) 09/08/2018 1615   GFRNONAA 56 (L) 09/12/2013 0739   GFRAA >60 09/08/2018 1615   GFRAA >60 09/12/2013 0739   CrCl cannot be calculated (Patient's most recent lab result is older than the  maximum 21 days allowed.).  COAG Lab Results  Component Value Date   INR 0.84 09/08/2018    Radiology No results found.   Assessment/Plan 1. Bilateral carotid artery stenosis (Primary) Recommend:   Given the patient's asymptomatic subcritical stenosis no further invasive testing or surgery at this time.   Duplex ultrasound shows RICA 60-79% and LICA 40-59% stenosis bilaterally.   Continue antiplatelet therapy as prescribed Continue management of CAD, HTN and Hyperlipidemia Healthy heart diet,  encouraged exercise at least 4 times per week.   With respect to her persistent neurological symptoms, she is to see Dr. Mason Sole in a few weeks.   Follow up in 6 months with duplex ultrasound and physical exam.  - VAS US  CAROTID; Future  2. Benign essential hypertension Continue antihypertensive medications as already ordered, these medications have been reviewed and there are no changes at this time.  3. Gastroesophageal reflux disease, unspecified whether esophagitis present Continue PPI as already ordered, this medication has been reviewed and there are no changes at this time.  Avoidence of caffeine and alcohol  Moderate elevation of the head of the bed   4. Hyperlipidemia associated with type 2 diabetes mellitus (HCC) Continue statin as ordered and  reviewed, no changes at this time  5. Type 2 diabetes mellitus with stage 3 chronic kidney disease, unspecified whether long term insulin  use, unspecified whether stage 3a or 3b CKD (HCC) Continue hypoglycemic medications as already ordered, these medications have been reviewed and there are no changes at this time.  Hgb A1C to be monitored as already arranged by primary service    Devon Fogo, MD  02/27/2024 10:42 AM

## 2024-02-28 ENCOUNTER — Ambulatory Visit (INDEPENDENT_AMBULATORY_CARE_PROVIDER_SITE_OTHER): Payer: Medicare HMO | Admitting: Vascular Surgery

## 2024-02-28 ENCOUNTER — Ambulatory Visit (INDEPENDENT_AMBULATORY_CARE_PROVIDER_SITE_OTHER): Payer: Medicare HMO

## 2024-02-28 VITALS — BP 110/71 | HR 78 | Resp 17 | Ht 66.0 in | Wt 155.2 lb

## 2024-02-28 DIAGNOSIS — E785 Hyperlipidemia, unspecified: Secondary | ICD-10-CM

## 2024-02-28 DIAGNOSIS — E1169 Type 2 diabetes mellitus with other specified complication: Secondary | ICD-10-CM

## 2024-02-28 DIAGNOSIS — N183 Chronic kidney disease, stage 3 unspecified: Secondary | ICD-10-CM

## 2024-02-28 DIAGNOSIS — K219 Gastro-esophageal reflux disease without esophagitis: Secondary | ICD-10-CM | POA: Diagnosis not present

## 2024-02-28 DIAGNOSIS — E1122 Type 2 diabetes mellitus with diabetic chronic kidney disease: Secondary | ICD-10-CM

## 2024-02-28 DIAGNOSIS — I10 Essential (primary) hypertension: Secondary | ICD-10-CM

## 2024-02-28 DIAGNOSIS — I6523 Occlusion and stenosis of bilateral carotid arteries: Secondary | ICD-10-CM | POA: Diagnosis not present

## 2024-03-04 ENCOUNTER — Encounter (INDEPENDENT_AMBULATORY_CARE_PROVIDER_SITE_OTHER): Payer: Self-pay | Admitting: Vascular Surgery

## 2024-03-10 ENCOUNTER — Ambulatory Visit
Admission: RE | Admit: 2024-03-10 | Discharge: 2024-03-10 | Disposition: A | Discharge status: Home / Self Care | Source: Ambulatory Visit | Attending: Internal Medicine | Admitting: Internal Medicine

## 2024-03-10 DIAGNOSIS — Z1231 Encounter for screening mammogram for malignant neoplasm of breast: Secondary | ICD-10-CM

## 2024-03-21 ENCOUNTER — Encounter (INDEPENDENT_AMBULATORY_CARE_PROVIDER_SITE_OTHER): Payer: Self-pay

## 2024-03-29 ENCOUNTER — Encounter: Payer: Self-pay | Admitting: Podiatry

## 2024-03-29 ENCOUNTER — Ambulatory Visit: Payer: Medicare HMO | Admitting: Podiatry

## 2024-03-29 ENCOUNTER — Ambulatory Visit: Admitting: Podiatry

## 2024-03-29 DIAGNOSIS — L603 Nail dystrophy: Secondary | ICD-10-CM | POA: Diagnosis not present

## 2024-03-29 NOTE — Progress Notes (Signed)
 She presents today for follow-up of her Lamisil  therapy she states that I just do not think is doing anything I feel like it has gotten worse.  Objective: Vital signs stable alert oriented x 3 toenails do demonstrate more of a nail dystrophy at this point the improvement has regressed considerably.  Assessment: Failure of Lamisil  therapy to render these toenails clear.  Plan: Discontinue use of treatment.

## 2024-09-14 DIAGNOSIS — E1122 Type 2 diabetes mellitus with diabetic chronic kidney disease: Secondary | ICD-10-CM | POA: Diagnosis not present

## 2024-10-30 ENCOUNTER — Other Ambulatory Visit: Admission: RE | Admit: 2024-10-30 | Discharge: 2024-10-30 | Disposition: A | Payer: Self-pay | Source: Ambulatory Visit

## 2024-10-30 DIAGNOSIS — R0789 Other chest pain: Secondary | ICD-10-CM | POA: Insufficient documentation

## 2024-10-30 LAB — TROPONIN T, HIGH SENSITIVITY: Troponin T High Sensitivity: <15 ng/L (ref 0–19)

## 2024-11-23 NOTE — Progress Notes (Shared)
 " [facility] - Clinic Note  12/04/2024     CHIEF COMPLAINT Patient presents for No chief complaint on file.   HISTORY OF PRESENT ILLNESS: Regina Moreno is a 69 y.o. female who presents to the clinic today for:    Pt states her cholesterol medication has been increased within the last month, she is on jardiance, metformin , basaglar and ozempic   Referring physician: Mevelyn JONETTA Bathe, OD [address] [place],  [place] [address]  HISTORICAL INFORMATION:   Selected notes from the MEDICAL RECORD NUMBER Referred by Dr. Bathe Mevelyn for retinal hemorrhages OU, diabetic eval LEE:  Ocular Hx- CEIOL OU 2023, [facility]-    CURRENT MEDICATIONS: No current outpatient medications on file. (Ophthalmic Drugs)   No current facility-administered medications for this visit. (Ophthalmic Drugs)   Current Outpatient Medications (Other)  Medication Sig   albuterol  (PROVENTIL  HFA;VENTOLIN  HFA) 108 (90 Base) MCG/ACT inhaler Inhale 2 puffs into the lungs every 6 (six) hours as needed for wheezing.   aspirin  81 MG tablet Take 4 tablets (325 mg total) by mouth daily.   atorvastatin  (LIPITOR) 40 MG tablet Take 1 tablet (40 mg total) by mouth daily at 6 PM. (Patient taking differently: Take 80 mg by mouth daily at 6 PM.)   B-D ULTRAFINE III SHORT PEN 31G X 8 MM MISC See admin instructions.   carvedilol (COREG) 6.25 MG tablet Take 6.25 mg by mouth 2 (two) times daily.   clopidogrel  (PLAVIX ) 75 MG tablet Take 1 tablet (75 mg total) by mouth daily.   gabapentin (NEURONTIN) 300 MG capsule Take by mouth.   Insulin  Glargine (BASAGLAR KWIKPEN) 100 UNIT/ML DIAL AND INJECT 30 UNITS UNDER THE SKIN EVERY EVENING. MAX DAILY DOSE IS 30 UNITS.   JARDIANCE 25 MG TABS tablet Take 25 mg by mouth daily.   metFORMIN  (GLUCOPHAGE ) 1000 MG tablet Take 1,000 mg by mouth 2 (two) times daily.   OZEMPIC, 1 MG/DOSE, 4 MG/3ML SOPN Inject into the skin once a week.   telmisartan (MICARDIS) 20 MG tablet Take 20  mg by mouth daily.   No current facility-administered medications for this visit. (Other)   REVIEW OF SYSTEMS:    ALLERGIES Allergies  Allergen Reactions   Ampicillin Nausea Only    Can take Augmentin   Fenofibrate Nausea Only   Simvastatin Nausea Only   Sulfa Antibiotics Hives    Other reaction(s): Unknown   Tramadol Nausea Only    Makes her very dizzy and nauseated    PAST MEDICAL HISTORY Past Medical History:  Diagnosis Date   Diabetes mellitus without complication (HCC)    Hypertension    Past Surgical History:  Procedure Laterality Date   ABDOMINAL HYSTERECTOMY     COLONOSCOPY WITH PROPOFOL  N/A 08/09/2023   Procedure: COLONOSCOPY WITH PROPOFOL ;  Surgeon: Therisa Bi, MD;  Location: [facility] ENDOSCOPY;  Service: Gastroenterology;  Laterality: N/A;   FAMILY HISTORY Family History  Problem Relation Age of Onset   CAD Mother    Diabetes Father    Breast cancer Neg Hx    SOCIAL HISTORY Social History   Tobacco Use   Smoking status: Former    Types: Cigarettes   Smokeless tobacco: Never  Vaping Use   Vaping status: Never Used  Substance Use Topics   Alcohol use: No   Drug use: No       OPHTHALMIC EXAM:  Not recorded    IMAGING AND PROCEDURES  Imaging and Procedures for 12/04/2024  ASSESSMENT/PLAN:    ICD-10-CM   1. Moderate nonproliferative diabetic retinopathy of both eyes without macular edema associated with type 2 diabetes mellitus (HCC)  Z88.6606     2. Long term (current) use of oral hypoglycemic drugs  Z79.84     3. Current use of insulin  (HCC)  Z79.4     4. Long-term (current) use of injectable non-insulin  antidiabetic drugs  Z79.85     5. Posterior vitreous detachment of left eye  H43.812     6. Essential hypertension  I10     7. Hypertensive retinopathy of both eyes  H35.033     8. Pseudophakia, both eyes  Z96.1       1-4. Moderate nonproliferative diabetic retinopathy w/o DME, both eyes   - A1c: 6.7 on 12.11.24 -  BCVA remains 20/20 OU - exam shows scattered MA/DBH OU -- mostly posteriorly  - OCT without diabetic macular edema, both eyes  - FA 03.25.24 shows scattered leaking MA greatest posteriorly OU; no NV OU - f/u in 12 mos -- DFE/OCT  5. PVD / vitreous syneresis OS  - subacute, symptomatic floaters, onset ~early March 2024 -- presented to Dr. Mevelyn on 03.13.24  - floaters improved today  - Discussed findings and prognosis  - +Weiss ring but no RT or RD on 360 peripheral exam  - Reviewed s/s of RT/RD  - Strict return precautions for any such RT/RD signs/symptoms  - monitor   6,7. Hypertensive retinopathy OU  - BP in office on 03.25.24 was 160s/80s - discussed importance of tight BP control and likely contribution to retinal hemorrhages - monitor   8. Pseudophakia OU  - s/p CE/IOL OU (Dr. Cleatus, OD 03.07.23, OS 02.07.23)  - IOL in good position, doing well  - monitor   Ophthalmic Meds Ordered this visit:  No orders of the defined types were placed in this encounter.    No follow-ups on file.  There are no Patient Instructions on file for this visit.   Explained the diagnoses, plan, and follow up with the patient and they expressed understanding.  Patient expressed understanding of the importance of proper follow up care.   This document serves as a record of services personally performed by Redell JUDITHANN Hans, MD, PhD. It was created on their behalf by Paulina Jamse Gay an ophthalmic technician. The creation of this record is the provider's dictation and/or activities during the visit.   Electronically signed by: Paulina JONETTA Gay  11/23/24  10:43 AM   Redell JUDITHANN Hans, M.D., Ph.D. Diseases & Surgery of the Retina and Vitreous [facility]   Abbreviations: M myopia (nearsighted); A astigmatism; H hyperopia (farsighted); P presbyopia; Mrx spectacle prescription;  CTL contact lenses; OD right eye; OS left eye; OU both eyes  XT exotropia; ET esotropia; PEK  punctate epithelial keratitis; PEE punctate epithelial erosions; DES dry eye syndrome; MGD meibomian gland dysfunction; ATs artificial tears; PFAT's preservative free artificial tears; NSC nuclear sclerotic cataract; PSC posterior subcapsular cataract; ERM epi-retinal membrane; PVD posterior vitreous detachment; RD retinal detachment; DM diabetes mellitus; DR diabetic retinopathy; NPDR non-proliferative diabetic retinopathy; PDR proliferative diabetic retinopathy; CSME clinically significant macular edema; DME diabetic macular edema; dbh dot blot hemorrhages; CWS cotton wool spot; POAG primary open angle glaucoma; C/D cup-to-disc ratio; HVF humphrey visual field; GVF goldmann visual field; OCT optical coherence tomography; IOP intraocular pressure; BRVO Branch retinal vein occlusion; CRVO central retinal vein occlusion; CRAO central retinal artery occlusion; BRAO branch retinal artery occlusion; RT retinal  tear; SB scleral buckle; PPV pars plana vitrectomy; VH Vitreous hemorrhage; PRP panretinal laser photocoagulation; IVK intravitreal kenalog; VMT vitreomacular traction; MH Macular hole;  NVD neovascularization of the disc; NVE neovascularization elsewhere; AREDS age related eye disease study; ARMD age related macular degeneration; POAG primary open angle glaucoma; EBMD epithelial/anterior basement membrane dystrophy; ACIOL anterior chamber intraocular lens; IOL intraocular lens; PCIOL posterior chamber intraocular lens; Phaco/IOL phacoemulsification with intraocular lens placement; PRK photorefractive keratectomy; LASIK laser assisted in situ keratomileusis; HTN hypertension; DM diabetes mellitus; COPD chronic obstructive pulmonary disease "

## 2024-12-04 ENCOUNTER — Encounter (INDEPENDENT_AMBULATORY_CARE_PROVIDER_SITE_OTHER): Payer: Medicare HMO | Admitting: Ophthalmology

## 2024-12-04 DIAGNOSIS — Z961 Presence of intraocular lens: Secondary | ICD-10-CM

## 2024-12-04 DIAGNOSIS — H35033 Hypertensive retinopathy, bilateral: Secondary | ICD-10-CM

## 2024-12-04 DIAGNOSIS — Z794 Long term (current) use of insulin: Secondary | ICD-10-CM

## 2024-12-04 DIAGNOSIS — Z7984 Long term (current) use of oral hypoglycemic drugs: Secondary | ICD-10-CM

## 2024-12-04 DIAGNOSIS — E113393 Type 2 diabetes mellitus with moderate nonproliferative diabetic retinopathy without macular edema, bilateral: Secondary | ICD-10-CM

## 2024-12-04 DIAGNOSIS — H43812 Vitreous degeneration, left eye: Secondary | ICD-10-CM

## 2024-12-04 DIAGNOSIS — I10 Essential (primary) hypertension: Secondary | ICD-10-CM

## 2024-12-04 DIAGNOSIS — Z7985 Long-term (current) use of injectable non-insulin antidiabetic drugs: Secondary | ICD-10-CM

## 2024-12-05 ENCOUNTER — Ambulatory Visit (INDEPENDENT_AMBULATORY_CARE_PROVIDER_SITE_OTHER): Admitting: Ophthalmology

## 2024-12-05 ENCOUNTER — Encounter (INDEPENDENT_AMBULATORY_CARE_PROVIDER_SITE_OTHER): Payer: Self-pay | Admitting: Ophthalmology

## 2024-12-05 DIAGNOSIS — Z794 Long term (current) use of insulin: Secondary | ICD-10-CM

## 2024-12-05 DIAGNOSIS — H35033 Hypertensive retinopathy, bilateral: Secondary | ICD-10-CM

## 2024-12-05 DIAGNOSIS — H43812 Vitreous degeneration, left eye: Secondary | ICD-10-CM

## 2024-12-05 DIAGNOSIS — E113393 Type 2 diabetes mellitus with moderate nonproliferative diabetic retinopathy without macular edema, bilateral: Secondary | ICD-10-CM

## 2024-12-05 DIAGNOSIS — I10 Essential (primary) hypertension: Secondary | ICD-10-CM

## 2024-12-05 DIAGNOSIS — Z961 Presence of intraocular lens: Secondary | ICD-10-CM | POA: Diagnosis not present

## 2024-12-05 DIAGNOSIS — E113313 Type 2 diabetes mellitus with moderate nonproliferative diabetic retinopathy with macular edema, bilateral: Secondary | ICD-10-CM

## 2024-12-05 DIAGNOSIS — Z7984 Long term (current) use of oral hypoglycemic drugs: Secondary | ICD-10-CM

## 2024-12-05 DIAGNOSIS — Z7985 Long-term (current) use of injectable non-insulin antidiabetic drugs: Secondary | ICD-10-CM | POA: Diagnosis not present

## 2024-12-06 ENCOUNTER — Encounter (INDEPENDENT_AMBULATORY_CARE_PROVIDER_SITE_OTHER): Payer: Self-pay | Admitting: Ophthalmology

## 2025-02-26 ENCOUNTER — Encounter (INDEPENDENT_AMBULATORY_CARE_PROVIDER_SITE_OTHER)

## 2025-02-26 ENCOUNTER — Ambulatory Visit (INDEPENDENT_AMBULATORY_CARE_PROVIDER_SITE_OTHER): Admitting: Vascular Surgery

## 2025-03-05 ENCOUNTER — Encounter (INDEPENDENT_AMBULATORY_CARE_PROVIDER_SITE_OTHER): Admitting: Ophthalmology
